# Patient Record
Sex: Female | Born: 1955 | Marital: Married | Smoking: Former smoker
Health system: Southern US, Community
[De-identification: ages and names within clinical notes are randomized; demographics above are authoritative.]

---

## 1993-06-02 HISTORY — PX: TUBAL LIGATION: SHX77

## 1998-11-08 DIAGNOSIS — Z87891 Personal history of nicotine dependence: Secondary | ICD-10-CM | POA: Insufficient documentation

## 2000-07-30 DIAGNOSIS — E7800 Pure hypercholesterolemia, unspecified: Secondary | ICD-10-CM | POA: Insufficient documentation

## 2003-02-28 DIAGNOSIS — I10 Essential (primary) hypertension: Secondary | ICD-10-CM | POA: Insufficient documentation

## 2005-04-10 ENCOUNTER — Ambulatory Visit: Payer: Self-pay | Admitting: Family Medicine

## 2006-05-06 ENCOUNTER — Ambulatory Visit: Payer: Self-pay | Admitting: Family Medicine

## 2007-05-11 ENCOUNTER — Ambulatory Visit: Payer: Self-pay | Admitting: Family Medicine

## 2007-10-21 ENCOUNTER — Ambulatory Visit: Payer: Self-pay | Admitting: Gastroenterology

## 2007-10-21 LAB — HM COLONOSCOPY: HM Colonoscopy: NORMAL

## 2008-08-30 ENCOUNTER — Ambulatory Visit: Payer: Self-pay | Admitting: Family Medicine

## 2009-09-05 ENCOUNTER — Ambulatory Visit: Payer: Self-pay | Admitting: Family Medicine

## 2010-09-17 ENCOUNTER — Ambulatory Visit: Payer: Self-pay | Admitting: Family Medicine

## 2011-11-11 ENCOUNTER — Ambulatory Visit: Payer: Self-pay | Admitting: Family Medicine

## 2012-10-11 LAB — HM PAP SMEAR

## 2012-10-11 LAB — BASIC METABOLIC PANEL
BUN: 10 mg/dL (ref 4–21)
CREATININE: 0.6 mg/dL (ref <=1.1)
GLUCOSE: 94 mg/dL
Potassium: 4.6 mmol/L (ref 3.4–5.3)
SODIUM: 139 mmol/L (ref 137–147)

## 2012-10-11 LAB — TSH: TSH: 0.55 u[IU]/mL (ref <=5.90)

## 2012-10-11 LAB — HEPATIC FUNCTION PANEL
ALT: 21 U/L (ref 7–35)
AST: 15 U/L (ref 13–35)

## 2012-10-14 LAB — CBC AND DIFFERENTIAL: Platelets: 367 10*3/uL (ref 150–399)

## 2012-11-11 ENCOUNTER — Ambulatory Visit: Payer: Self-pay | Admitting: Family Medicine

## 2012-12-14 ENCOUNTER — Ambulatory Visit: Payer: Self-pay

## 2013-10-11 LAB — CBC AND DIFFERENTIAL
HEMOGLOBIN: 13.2 g/dL (ref 12.0–16.0)
WBC: 8.8 10^3/mL

## 2013-10-14 LAB — CBC AND DIFFERENTIAL: HCT: 39 % (ref 36–46)

## 2013-11-24 ENCOUNTER — Ambulatory Visit: Payer: Self-pay | Admitting: Family Medicine

## 2013-11-24 LAB — HM MAMMOGRAPHY

## 2013-12-07 ENCOUNTER — Ambulatory Visit: Payer: Self-pay | Admitting: Unknown Physician Specialty

## 2014-10-03 DIAGNOSIS — Z8669 Personal history of other diseases of the nervous system and sense organs: Secondary | ICD-10-CM | POA: Insufficient documentation

## 2014-10-03 DIAGNOSIS — S92909A Unspecified fracture of unspecified foot, initial encounter for closed fracture: Secondary | ICD-10-CM | POA: Insufficient documentation

## 2014-10-03 DIAGNOSIS — R202 Paresthesia of skin: Secondary | ICD-10-CM | POA: Insufficient documentation

## 2014-10-03 DIAGNOSIS — R430 Anosmia: Secondary | ICD-10-CM | POA: Insufficient documentation

## 2014-10-03 DIAGNOSIS — G5600 Carpal tunnel syndrome, unspecified upper limb: Secondary | ICD-10-CM | POA: Insufficient documentation

## 2014-10-03 DIAGNOSIS — G5601 Carpal tunnel syndrome, right upper limb: Secondary | ICD-10-CM | POA: Insufficient documentation

## 2014-10-03 DIAGNOSIS — J309 Allergic rhinitis, unspecified: Secondary | ICD-10-CM | POA: Insufficient documentation

## 2014-11-27 ENCOUNTER — Ambulatory Visit (INDEPENDENT_AMBULATORY_CARE_PROVIDER_SITE_OTHER): Payer: Managed Care, Other (non HMO) | Admitting: Family Medicine

## 2014-11-27 ENCOUNTER — Encounter: Payer: Self-pay | Admitting: Family Medicine

## 2014-11-27 VITALS — BP 144/92 | HR 72 | Temp 98.7°F | Resp 16 | Ht 62.5 in | Wt 154.0 lb

## 2014-11-27 DIAGNOSIS — R319 Hematuria, unspecified: Secondary | ICD-10-CM | POA: Diagnosis not present

## 2014-11-27 DIAGNOSIS — Z1231 Encounter for screening mammogram for malignant neoplasm of breast: Secondary | ICD-10-CM | POA: Diagnosis not present

## 2014-11-27 DIAGNOSIS — Z1211 Encounter for screening for malignant neoplasm of colon: Secondary | ICD-10-CM | POA: Diagnosis not present

## 2014-11-27 DIAGNOSIS — M25512 Pain in left shoulder: Secondary | ICD-10-CM

## 2014-11-27 DIAGNOSIS — E7800 Pure hypercholesterolemia, unspecified: Secondary | ICD-10-CM

## 2014-11-27 DIAGNOSIS — I10 Essential (primary) hypertension: Secondary | ICD-10-CM

## 2014-11-27 DIAGNOSIS — Z0000 Encounter for general adult medical examination without abnormal findings: Secondary | ICD-10-CM

## 2014-11-27 DIAGNOSIS — M25511 Pain in right shoulder: Secondary | ICD-10-CM

## 2014-11-27 LAB — POCT URINALYSIS DIPSTICK
Bilirubin, UA: NEGATIVE
GLUCOSE UA: NEGATIVE
KETONES UA: NEGATIVE
Leukocytes, UA: NEGATIVE
Nitrite, UA: NEGATIVE
Urobilinogen, UA: 0.2
pH, UA: 5

## 2014-11-27 LAB — IFOBT (OCCULT BLOOD): IFOBT: NEGATIVE

## 2014-11-27 MED ORDER — LISINOPRIL-HYDROCHLOROTHIAZIDE 10-12.5 MG PO TABS: 1.0000 | ORAL_TABLET | Freq: Every day | ORAL | Status: DC

## 2014-11-27 NOTE — Progress Notes (Signed)
Patient ID: Kristen Wall, female   DOB: 1955/07/27, 59 y.o.   MRN: [id number]        Patient: Kristen Wall, Female    DOB: 01-08-1956, 59 y.o.   MRN: [id number] Visit Date: 11/27/2014  Today's Provider: Margarita Rana, MD   Chief Complaint  Patient presents with  . Annual Exam   Subjective:    Annual physical exam Kristen Wall is a 59 y.o. female who presents today for health maintenance and complete physical. She feels fairly well.  Pt as been having trouble bilateral shoulder pain.  Started a few months ago.   She reports exercising occasionally. She reports she is sleeping well.  ----------------------------------------------------------------- Shoulder Pain  The pain is present in the left shoulder and right shoulder. This is a new problem. The current episode started more than 1 month ago. There has been no history of extremity trauma. The problem occurs daily. The problem has been unchanged. The quality of the pain is described as dull and aching. The pain is at a severity of 4/10. The pain is mild. Associated symptoms include numbness (Fingers go numb.). Pertinent negatives include no joint locking, joint swelling or limited range of motion. The symptoms are aggravated by lying down. She has tried movement for the symptoms. The treatment provided mild relief.     Review of Systems  Constitutional: Positive for diaphoresis.  HENT: Negative.   Eyes: Negative.   Respiratory: Negative.   Cardiovascular: Positive for leg swelling.  Gastrointestinal: Negative.   Endocrine: Negative.   Genitourinary: Negative.   Musculoskeletal: Positive for myalgias and neck pain.  Skin: Negative.   Allergic/Immunologic: Negative.   Neurological: Positive for numbness (Fingers go numb.).  Hematological: Negative.   Psychiatric/Behavioral: Negative.     Social History She  reports that she quit smoking about 7 years ago. She has never used smokeless tobacco. She reports that she drinks  alcohol. She reports that she does not use illicit drugs.  Patient Active Problem List   Diagnosis Date Noted  . Allergic rhinitis 10/03/2014  . 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 Carpal tunnel syndrome 10/03/2014  . Closed fracture of foot 10/03/2014  . H/O: glaucoma 10/03/2014  . Burning or prickling sensation 10/03/2014  . BP (high blood pressure) 02/28/2003  . Hypercholesteremia 07/30/2000  . History of tobacco use 11/08/1998    Past Surgical History  Procedure Laterality Date  . Tubal ligation  1995    Family History Her family history includes Arthritis in her mother; Coronary artery disease in her mother; Diabetes in her mother; Esophageal cancer in her father; Heart attack in her brother and maternal grandmother; Hypertension in her mother.    Previous Medications   BIMATOPROST (LUMIGAN) 0.01 % SOLN    Apply to eye.   CHOLECALCIFEROL (VITAMIN D) 2000 UNITS TABLET    Take by mouth.   LISINOPRIL-HYDROCHLOROTHIAZIDE (PRINZIDE,ZESTORETIC) 10-12.5 MG PER TABLET    Take by mouth.   MULTIPLE VITAMINS PO    Take by mouth.   OMEGA 3-6-9 FATTY ACIDS PO    Take by mouth.   SIMVASTATIN (ZOCOR) 20 MG TABLET    Take by mouth.    Patient Care Team: Margarita Rana, MD as PCP - General (Family Medicine)     Objective:   Vitals: BP 144/92 mmHg  Pulse 72  Temp(Src) 98.7 F (37.1 C) (Oral)  Resp 16  Ht 5' 2.5" (1.588 m)  Wt 154 lb (69.854 kg)  BMI 27.70 kg/m2   Physical Exam  Constitutional:  She is oriented to person, place, and time. She appears well-developed and well-nourished.  HENT:  Head: Normocephalic and atraumatic.  Right Ear: Hearing, tympanic membrane, external ear and ear canal normal.  Left Ear: Hearing, tympanic membrane, external ear and ear canal normal.  Nose: Nose normal.  Mouth/Throat: Uvula is midline, oropharynx is clear and moist and mucous membranes are normal.  Eyes: Conjunctivae, EOM and lids are normal. Pupils are equal, round, and reactive to  light.  Neck: Trachea normal and normal range of motion. Neck supple. Carotid bruit is not present.  Cardiovascular: Normal rate, regular rhythm and normal heart sounds.   Pulmonary/Chest: Effort normal and breath sounds normal. Right breast exhibits no inverted nipple, no mass, no nipple discharge, no skin change and no tenderness. Left breast exhibits no inverted nipple, no mass, no nipple discharge, no skin change and no tenderness. Breasts are symmetrical.  Abdominal: Soft. Normal appearance, normal aorta and bowel sounds are normal.  Musculoskeletal: Normal range of motion.  Tender over Trapezius Muscle bilaterally.  Neurological: She is alert and oriented to person, place, and time.  Skin: Skin is warm, dry and intact.  Psychiatric: She has a normal mood and affect. Judgment normal. Cognition and memory are normal.     Depression Screen No flowsheet data found.    Assessment & Plan:     Routine Health Maintenance and Physical Exam  Exercise Activities and Dietary recommendations Goals    None     Discussed health benefits of physical activity, and encouraged her to engage in regular exercise appropriate for her age and condition.     2. Essential hypertension Stable, refills provided, checked labs.   - lisinopril-hydrochlorothiazide (PRINZIDE,ZESTORETIC) 10-12.5 MG per tablet; Take 1 tablet by mouth daily.  Dispense: 30 tablet; Refill: 5 - CBC with Differential/Platelet - Comprehensive metabolic panel - TSH  3. Encounter for screening mammogram for breast cancer - MM Digital Screening  4. Hypercholesteremia Will check labs.   - Lipid panel  5. Hematuria History of Hematuria in the past.  Provided pt with a lab sheet to recheck when she gets her blood work done.    - POCT urinalysis dipstick - Urinalysis, Routine w reflex microscopic  6. Screening for colon cancer [facility] [place] negative.   - IFOBT POC (occult bld, rslt in office)  7. Bilateral shoulder  pain New problem, will continue to monitor.  She will call if she needs a referral to Orthopedics.    Patient was seen and examined by Jerrell Belfast, MD, and note scribed by Ashley Royalty, CMA.  I have reviewed the document for accuracy and completeness and I agree with above. Jerrell Belfast, MD   Margarita Rana, MD   --------------------------------------------------------------------

## 2014-11-29 ENCOUNTER — Other Ambulatory Visit
Admission: RE | Admit: 2014-11-29 | Discharge: 2014-11-29 | Disposition: A | Discharge status: Home / Self Care | Payer: Managed Care, Other (non HMO) | Source: Ambulatory Visit | Attending: Family Medicine | Admitting: Family Medicine

## 2014-11-29 DIAGNOSIS — R319 Hematuria, unspecified: Secondary | ICD-10-CM | POA: Insufficient documentation

## 2014-11-29 DIAGNOSIS — I10 Essential (primary) hypertension: Secondary | ICD-10-CM | POA: Diagnosis not present

## 2014-11-29 DIAGNOSIS — E780 Pure hypercholesterolemia: Secondary | ICD-10-CM | POA: Diagnosis present

## 2014-11-29 LAB — CBC WITH DIFFERENTIAL/PLATELET
BASOS ABS: 0 10*3/uL (ref 0–0.1)
Basophils Relative: 1 %
EOS PCT: 1 %
Eosinophils Absolute: 0.1 10*3/uL (ref 0–0.7)
HCT: 40.9 % (ref 35.0–47.0)
Hemoglobin: 14 g/dL (ref 12.0–16.0)
LYMPHS ABS: 1.2 10*3/uL (ref 1.0–3.6)
Lymphocytes Relative: 21 %
MCH: 31.3 pg (ref 26.0–34.0)
MCHC: 34.1 g/dL (ref 32.0–36.0)
MCV: 91.9 fL (ref 80.0–100.0)
MONOS PCT: 8 %
Monocytes Absolute: 0.5 10*3/uL (ref 0.2–0.9)
NEUTROS PCT: 69 %
Neutro Abs: 4.1 10*3/uL (ref 1.4–6.5)
PLATELETS: 302 10*3/uL (ref 150–440)
RBC: 4.46 MIL/uL (ref 3.80–5.20)
RDW: 12.5 % (ref 11.5–14.5)
WBC: 5.9 10*3/uL (ref 3.6–11.0)

## 2014-11-29 LAB — LIPID PANEL
CHOL/HDL RATIO: 5.3 ratio
CHOLESTEROL: 295 mg/dL — AB (ref 0–200)
HDL: 56 mg/dL (ref >40)
LDL Cholesterol: UNDETERMINED mg/dL (ref 0–99)
TRIGLYCERIDES: 456 mg/dL — AB (ref <150)
VLDL: UNDETERMINED mg/dL (ref 0–40)

## 2014-11-29 LAB — URINALYSIS COMPLETE WITH MICROSCOPIC (ARMC ONLY)
BILIRUBIN URINE: NEGATIVE
Glucose, UA: NEGATIVE mg/dL
KETONES UR: NEGATIVE mg/dL
Leukocytes, UA: NEGATIVE
Nitrite: NEGATIVE
Protein, ur: NEGATIVE mg/dL
Specific Gravity, Urine: 1.02 (ref 1.005–1.030)
pH: 5.5 (ref 5.0–8.0)

## 2014-11-29 LAB — COMPREHENSIVE METABOLIC PANEL
ALT: 32 U/L (ref 14–54)
ANION GAP: 12 (ref 5–15)
AST: 25 U/L (ref 15–41)
Albumin: 4.4 g/dL (ref 3.5–5.0)
Alkaline Phosphatase: 71 U/L (ref 38–126)
BILIRUBIN TOTAL: 0.3 mg/dL (ref 0.3–1.2)
BUN: 9 mg/dL (ref 6–20)
CALCIUM: 9.1 mg/dL (ref 8.9–10.3)
CHLORIDE: 98 mmol/L — AB (ref 101–111)
CO2: 26 mmol/L (ref 22–32)
Creatinine, Ser: 0.51 mg/dL (ref 0.44–1.00)
Glucose, Bld: 116 mg/dL — ABNORMAL HIGH (ref 65–99)
Potassium: 3.9 mmol/L (ref 3.5–5.1)
Sodium: 136 mmol/L (ref 135–145)
Total Protein: 7.8 g/dL (ref 6.5–8.1)

## 2014-11-29 LAB — TSH: TSH: 0.975 u[IU]/mL (ref 0.350–4.500)

## 2014-11-30 ENCOUNTER — Telehealth: Payer: Self-pay

## 2014-11-30 NOTE — Telephone Encounter (Signed)
Pt advised; apt made for 12/12/2014  Thanks,   -Mickel Baas

## 2014-11-30 NOTE — Telephone Encounter (Signed)
-----   Message from Margarita Rana, MD sent at 11/29/2014  7:32 PM EDT ----- Cholesterol high at 295 and high blood sugar and triglycerides. Needs follow up to check A1c and discuss treatment plan. Thanks.

## 2014-12-12 ENCOUNTER — Encounter: Payer: Self-pay | Admitting: Family Medicine

## 2014-12-12 ENCOUNTER — Ambulatory Visit (INDEPENDENT_AMBULATORY_CARE_PROVIDER_SITE_OTHER): Payer: Managed Care, Other (non HMO) | Admitting: Family Medicine

## 2014-12-12 VITALS — BP 126/78 | HR 64 | Temp 98.6°F | Resp 16 | Ht 63.0 in | Wt 158.0 lb

## 2014-12-12 DIAGNOSIS — E7800 Pure hypercholesterolemia, unspecified: Secondary | ICD-10-CM

## 2014-12-12 DIAGNOSIS — I10 Essential (primary) hypertension: Secondary | ICD-10-CM

## 2014-12-12 LAB — POCT GLYCOSYLATED HEMOGLOBIN (HGB A1C): Hemoglobin A1C: 5.4

## 2014-12-12 MED ORDER — SIMVASTATIN 20 MG PO TABS
20.0000 mg | ORAL_TABLET | Freq: Every evening | ORAL | Status: DC
Start: 2014-12-12 — End: 2016-01-27

## 2014-12-12 NOTE — Progress Notes (Signed)
Patient ID: Kristen Wall, female   DOB: 25-May-1956, 59 y.o.   MRN: [id number]       Patient: Kristen Wall Female    DOB: [birth year]   58 y.o.   MRN: [id number] Visit Date: 12/12/2014  Today's Provider: Margarita Rana, MD   Chief Complaint  Patient presents with  . Hypertension    follow up    Subjective:    Hypertension This is a chronic problem. The problem is unchanged. The problem is controlled. Pertinent negatives include no blurred vision, headaches, palpitations, peripheral edema, shortness of breath or sweats. Risk factors: pre-diabetes. The current treatment provides significant improvement. There are no compliance problems.    Cholesterol, blood sugar and triglycerides elevated when labs drawn. Here to discuss treatment today.  Does have family history of diabetes.  Does not exercise currently.     No Known Allergies Previous Medications   BIMATOPROST (LUMIGAN) 0.01 % SOLN    Apply to eye.   CHOLECALCIFEROL (VITAMIN D) 2000 UNITS TABLET    Take by mouth.   LISINOPRIL-HYDROCHLOROTHIAZIDE (PRINZIDE,ZESTORETIC) 10-12.5 MG PER TABLET    Take 1 tablet by mouth daily.   MULTIPLE VITAMINS PO    Take by mouth.   OMEGA 3-6-9 FATTY ACIDS PO    Take by mouth.   SIMVASTATIN (ZOCOR) 20 MG TABLET    Take by mouth.    Review of Systems  Constitutional: Negative.   Eyes: Negative for blurred vision.  Respiratory: Negative for shortness of breath.   Cardiovascular: Negative.  Negative for palpitations.  Neurological: Negative for headaches.    History  Substance Use Topics  . Smoking status: Former Smoker    Quit date: 06/03/2007  . Smokeless tobacco: Never Used  . Alcohol Use: Yes     Comment: Occasional, Drinks beer for social events.   Objective:   BP 126/78 mmHg  Pulse 64  Temp(Src) 98.6 F (37 C)  Resp 16  Ht 5\' 3"  (1.6 m)  Wt 158 lb (71.668 kg)  BMI 28.00 kg/m2  Physical Exam  Constitutional: She is oriented to person, place, and time. She appears  well-developed and well-nourished.  Neurological: She is alert and oriented to person, place, and time.  Psychiatric: She has a normal mood and affect. Her behavior is normal. Judgment and thought content normal.        Assessment & Plan:     1. Essential hypertension Stable. Continue medication.   2. Hypercholesteremia Will restart medication. Did not have any trouble with medication in the past.  - simvastatin (ZOCOR) 20 MG tablet; Take 1 tablet (20 mg total) by mouth every evening.  Dispense: 90 tablet; Refill: 3   3. For elevated triglycerides and borderline sugar-   Results for orders placed or performed in visit on 12/12/14 (from the past 24 hour(s))  POCT HgB A1C     Status: None   Collection Time: 12/12/14  5:41 PM  Result Value Ref Range   Hemoglobin A1C 5.4    Goals    . Exercise 150 minutes per week (moderate activity)    . Reduce portion size     Decrease carbohydrates.        Follow up:     As previously scheduled.   Margarita Rana, MD  [facility]

## 2015-01-03 ENCOUNTER — Ambulatory Visit
Admission: RE | Admit: 2015-01-03 | Discharge: 2015-01-03 | Disposition: A | Discharge status: Home / Self Care | Payer: Managed Care, Other (non HMO) | Source: Ambulatory Visit | Attending: Family Medicine | Admitting: Family Medicine

## 2015-01-03 DIAGNOSIS — Z1231 Encounter for screening mammogram for malignant neoplasm of breast: Secondary | ICD-10-CM | POA: Insufficient documentation

## 2015-08-01 ENCOUNTER — Other Ambulatory Visit: Payer: Self-pay | Admitting: Family Medicine

## 2015-08-01 DIAGNOSIS — I10 Essential (primary) hypertension: Secondary | ICD-10-CM

## 2015-11-29 ENCOUNTER — Ambulatory Visit (INDEPENDENT_AMBULATORY_CARE_PROVIDER_SITE_OTHER): Payer: Managed Care, Other (non HMO) | Admitting: Family Medicine

## 2015-11-29 ENCOUNTER — Encounter: Payer: Self-pay | Admitting: Family Medicine

## 2015-11-29 VITALS — BP 118/80 | HR 68 | Temp 98.7°F | Resp 16 | Ht 63.0 in | Wt 159.0 lb

## 2015-11-29 DIAGNOSIS — R319 Hematuria, unspecified: Secondary | ICD-10-CM

## 2015-11-29 DIAGNOSIS — I10 Essential (primary) hypertension: Secondary | ICD-10-CM | POA: Diagnosis not present

## 2015-11-29 DIAGNOSIS — E7800 Pure hypercholesterolemia, unspecified: Secondary | ICD-10-CM | POA: Diagnosis not present

## 2015-11-29 DIAGNOSIS — R7309 Other abnormal glucose: Secondary | ICD-10-CM | POA: Insufficient documentation

## 2015-11-29 DIAGNOSIS — Z0000 Encounter for general adult medical examination without abnormal findings: Secondary | ICD-10-CM

## 2015-11-29 LAB — POCT URINALYSIS DIPSTICK
Bilirubin, UA: NEGATIVE
GLUCOSE UA: NEGATIVE
Ketones, UA: NEGATIVE
Leukocytes, UA: NEGATIVE
Nitrite, UA: NEGATIVE
Protein, UA: NEGATIVE
SPEC GRAV UA: 1.01
UROBILINOGEN UA: 0.2
pH, UA: 7.5

## 2015-11-29 NOTE — Progress Notes (Deleted)
         Patient: Kristen Wall Female    DOB: Oct 17, 1955   60 y.o.   MRN: [id number] Visit Date: 11/29/2015  Today's Provider: Margarita Rana, MD   Chief Complaint  Patient presents with  . Annual Exam   Subjective:    HPI     No Known Allergies No outpatient prescriptions have been marked as taking for the 11/29/15 encounter (Office Visit) with Margarita Rana, MD.    Review of Systems  Social History  Substance Use Topics  . Smoking status: Former Smoker    Quit date: 06/03/2007  . Smokeless tobacco: Never Used  . Alcohol Use: Yes     Comment: Occasional, Drinks beer for social events.   Objective:   There were no vitals taken for this visit.  Physical Exam      Assessment & Plan:           Margarita Rana, MD  [facility]

## 2015-11-29 NOTE — Progress Notes (Signed)
Patient: Kristen Wall, Female    DOB: 09/27/1955, 60 y.o.   MRN: [id number] Visit Date: 11/29/2015  Today's Provider: Margarita Rana, MD   Chief Complaint  Patient presents with  . Annual Exam   Subjective:    Annual physical exam Kristen Wall is a 60 y.o. female who presents today for health maintenance and complete physical. She feels well. She reports exercising regularly. She reports she is sleeping well.  -----------------------------------------------------------------   Review of Systems  Constitutional: Negative.   HENT: Negative.   Eyes: Positive for redness. Negative for photophobia, pain, discharge, itching and visual disturbance.  Respiratory: Negative.   Cardiovascular: Negative.   Gastrointestinal: Negative.   Endocrine: Negative.   Genitourinary: Negative.   Musculoskeletal: Negative.   Skin: Negative.   Allergic/Immunologic: Negative.   Neurological: Negative.   Hematological: Negative.   Psychiatric/Behavioral: Negative.     Social History      She  reports that she quit smoking about 8 years ago. She has never used smokeless tobacco. She reports that she drinks alcohol. She reports that she does not use illicit drugs.       Social History   Social History  . Marital Status: Married    Spouse Name: [name]  . Number of Children: 2  . Years of Education: College   Occupational History  . [employer]     Full-Time   Social History Main Topics  . Smoking status: Former Smoker    Quit date: 06/03/2007  . Smokeless tobacco: Never Used  . Alcohol Use: Yes     Comment: Occasional, Drinks beer for social events.  . Drug Use: No  . Sexual Activity: Not Asked   Other Topics Concern  . None   Social History Narrative    No past medical history on file.   Patient Active Problem List   Diagnosis Date Noted  . Hematuria 11/27/2014  . Screening for colon cancer 11/27/2014  . Allergic rhinitis 10/03/2014  . Absent sense of smell  10/03/2014  . Carpal tunnel syndrome 10/03/2014  . Closed fracture of foot 10/03/2014  . H/O: glaucoma 10/03/2014  . Burning or prickling sensation 10/03/2014  . BP (high blood pressure) 02/28/2003  . Hypercholesteremia 07/30/2000  . History of tobacco use 11/08/1998    Past Surgical History  Procedure Laterality Date  . Tubal ligation  1995    Family History        Family Status  Relation Status Death Age  . Mother Deceased 85  . Father Deceased 26  . Brother Deceased 73  . Maternal Grandmother Deceased         Her family history includes Arthritis in her mother; Breast cancer in her cousin and other; Coronary artery disease in her mother; Diabetes in her mother; Esophageal cancer in her father; Heart attack in her brother and maternal grandmother; Hypertension in her mother.    No Known Allergies  No outpatient prescriptions have been marked as taking for the 11/29/15 encounter (Office Visit) with Margarita Rana, MD.    Patient Care Team: Margarita Rana, MD as PCP - General (Family Medicine)     Objective:   Vitals: BP 118/80 mmHg  Pulse 68  Temp(Src) 98.7 F (37.1 C) (Oral)  Resp 16  Ht 5\' 3"  (1.6 m)  Wt 159 lb (72.122 kg)  BMI 28.17 kg/m2   Physical Exam  Constitutional: She is oriented to person, place, and time. She appears well-developed and well-nourished.  HENT:  Head: Normocephalic and atraumatic.  Right Ear: External ear normal.  Left Ear: External ear normal.  Nose: Nose normal.  Mouth/Throat: Oropharynx is clear and moist.  Eyes: Conjunctivae and EOM are normal. Pupils are equal, round, and reactive to light.  Neck: Normal range of motion. Neck supple. Carotid bruit is not present.  Cardiovascular: Normal rate, regular rhythm, normal heart sounds and intact distal pulses.   Pulmonary/Chest: Effort normal and breath sounds normal. Right breast exhibits no inverted nipple, no mass, no nipple discharge, no skin change and no tenderness. Left breast  exhibits no inverted nipple, no mass, no nipple discharge, no skin change and no tenderness. Breasts are symmetrical.  Abdominal: Soft. Bowel sounds are normal.  Neurological: She is alert and oriented to person, place, and time. She has normal reflexes.  Skin: Skin is warm and dry.  Psychiatric: She has a normal mood and affect. Her behavior is normal. Judgment and thought content normal.      Assessment & Plan:     Routine Health Maintenance and Physical Exam  Exercise Activities and Dietary recommendations Goals    . Exercise 150 minutes per week (moderate activity)    . Reduce portion size     Decrease carbohydrates.        Immunization History  Administered Date(s) Administered  . Td 06/23/1995    Health Maintenance  Topic Date Due  . Hepatitis C Screening  07/24/1955  . HIV Screening  08/30/1971  . TETANUS/TDAP  06/22/2005  . PAP SMEAR  10/12/2015  . INFLUENZA VACCINE  01/01/2016  . MAMMOGRAM  01/02/2017  . COLONOSCOPY  10/20/2017      Discussed health benefits of physical activity, and encouraged her to engage in regular exercise appropriate for her age and condition.   2. Essential hypertension Stable; continue current medications.   - CBC with Differential/Platelet - Comprehensive metabolic panel - TSH  3. Hematuria Pt has a history of this; will send for U/A. Was no blood when sent to labs last time.    - Urinalysis, Routine w reflex microscopic  4. Hypercholesteremia Stable; will check labs.   - Lipid panel  5. Abnormal glucose Stable; will check labs.  - Comprehensive metabolic panel - Hemoglobin A1c  -------------------------------------------------------------------- Patient was seen and examined by Jerrell Belfast, MD, and note scribed by Ashley Royalty, CMA. I have reviewed the document for accuracy and completeness and I agree with above. - Jerrell Belfast, MD     Margarita Rana, MD  [facility]

## 2015-11-30 LAB — URINALYSIS, ROUTINE W REFLEX MICROSCOPIC
BILIRUBIN UA: NEGATIVE
Glucose, UA: NEGATIVE
KETONES UA: NEGATIVE
LEUKOCYTES UA: NEGATIVE
Nitrite, UA: NEGATIVE
PROTEIN UA: NEGATIVE
RBC UA: NEGATIVE
SPEC GRAV UA: 1.02 (ref 1.005–1.030)
Urobilinogen, Ur: 0.2 mg/dL (ref 0.2–1.0)
pH, UA: 7 (ref 5.0–7.5)

## 2015-11-30 LAB — PLEASE NOTE

## 2015-12-05 ENCOUNTER — Encounter: Payer: Managed Care, Other (non HMO) | Admitting: Family Medicine

## 2016-01-07 ENCOUNTER — Telehealth: Payer: Self-pay | Admitting: Physician Assistant

## 2016-01-07 DIAGNOSIS — Z1239 Encounter for other screening for malignant neoplasm of breast: Secondary | ICD-10-CM

## 2016-01-07 NOTE — Telephone Encounter (Signed)
Is it okay to order? Or should pt establish care? Renaldo Fiddler, CMA

## 2016-01-07 NOTE — Telephone Encounter (Signed)
Pt needs to have a mammogram scheduled in [place].  She called to make an appt and they ask her to have [place] make the appt.  She was Dr. Sharyon Medicus pat. But will be seeing Sonia Baller.  Her call back is [phone]  Thanks Con Memos

## 2016-01-07 NOTE — Telephone Encounter (Signed)
Pt advised. Kristen Wall, CMA  

## 2016-01-07 NOTE — Telephone Encounter (Signed)
Mammogram ordered since she just had CPE with [facility] on 11/29/15. She may call back to schedule now.

## 2016-01-16 ENCOUNTER — Ambulatory Visit: Admission: RE | Admit: 2016-01-16 | Payer: 59 | Source: Ambulatory Visit

## 2016-01-16 ENCOUNTER — Ambulatory Visit
Admission: RE | Admit: 2016-01-16 | Discharge: 2016-01-16 | Disposition: A | Discharge status: Home / Self Care | Payer: Managed Care, Other (non HMO) | Source: Ambulatory Visit | Attending: Physician Assistant | Admitting: Physician Assistant

## 2016-01-16 ENCOUNTER — Other Ambulatory Visit: Payer: Self-pay | Admitting: Physician Assistant

## 2016-01-16 DIAGNOSIS — Z1239 Encounter for other screening for malignant neoplasm of breast: Secondary | ICD-10-CM | POA: Diagnosis not present

## 2016-01-16 DIAGNOSIS — Z1231 Encounter for screening mammogram for malignant neoplasm of breast: Secondary | ICD-10-CM | POA: Insufficient documentation

## 2016-01-17 ENCOUNTER — Telehealth: Payer: Self-pay

## 2016-01-17 NOTE — Telephone Encounter (Signed)
-----   Message from [name], [place] sent at 01/17/2016 11:04 AM EDT ----- Normal mammogram. Repeat screening in one year.

## 2016-01-17 NOTE — Telephone Encounter (Signed)
LMTCB  Thanks, [employer]

## 2016-01-18 NOTE — Telephone Encounter (Signed)
Pt advised.   Thanks,   -Laura  

## 2016-01-27 ENCOUNTER — Other Ambulatory Visit: Payer: Self-pay | Admitting: Family Medicine

## 2016-01-27 DIAGNOSIS — E7800 Pure hypercholesterolemia, unspecified: Secondary | ICD-10-CM

## 2016-01-28 NOTE — Telephone Encounter (Signed)
Jenni's pt.   Thanks,   -Lama Narayanan  

## 2016-01-31 IMAGING — MG MM DIGITAL SCREENING BILAT W/ CAD
1 series · 4 of 4 positions shown · non-contrast
Comparison: Previous exam(s).

CLINICAL DATA: Screening.

EXAM:
DIGITAL SCREENING BILATERAL MAMMOGRAM WITH CAD

[R CC · right · 4 of 4 slices shown]
[im 1/4]
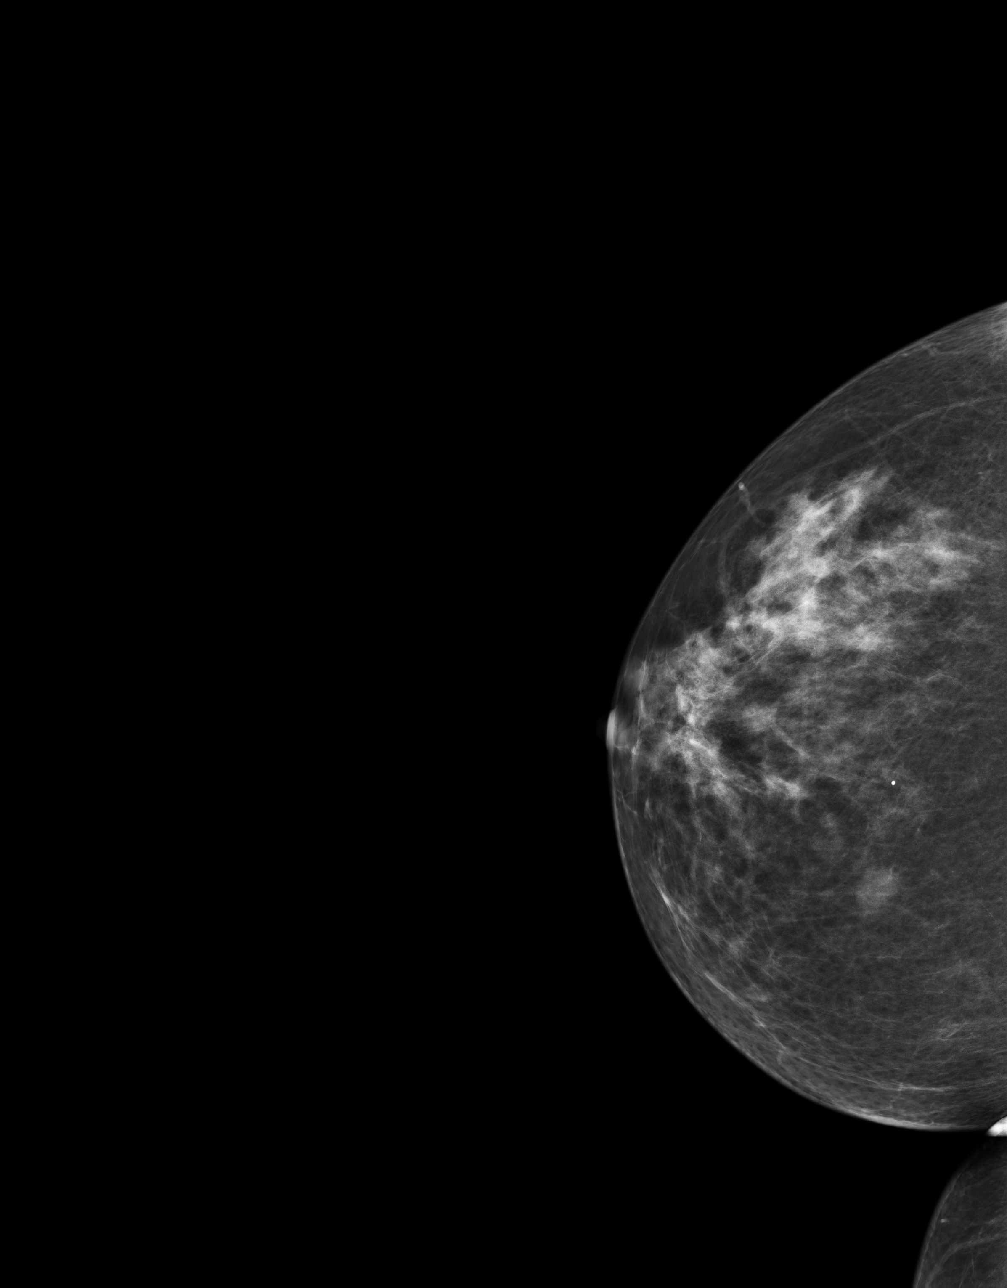
[im 2/4]
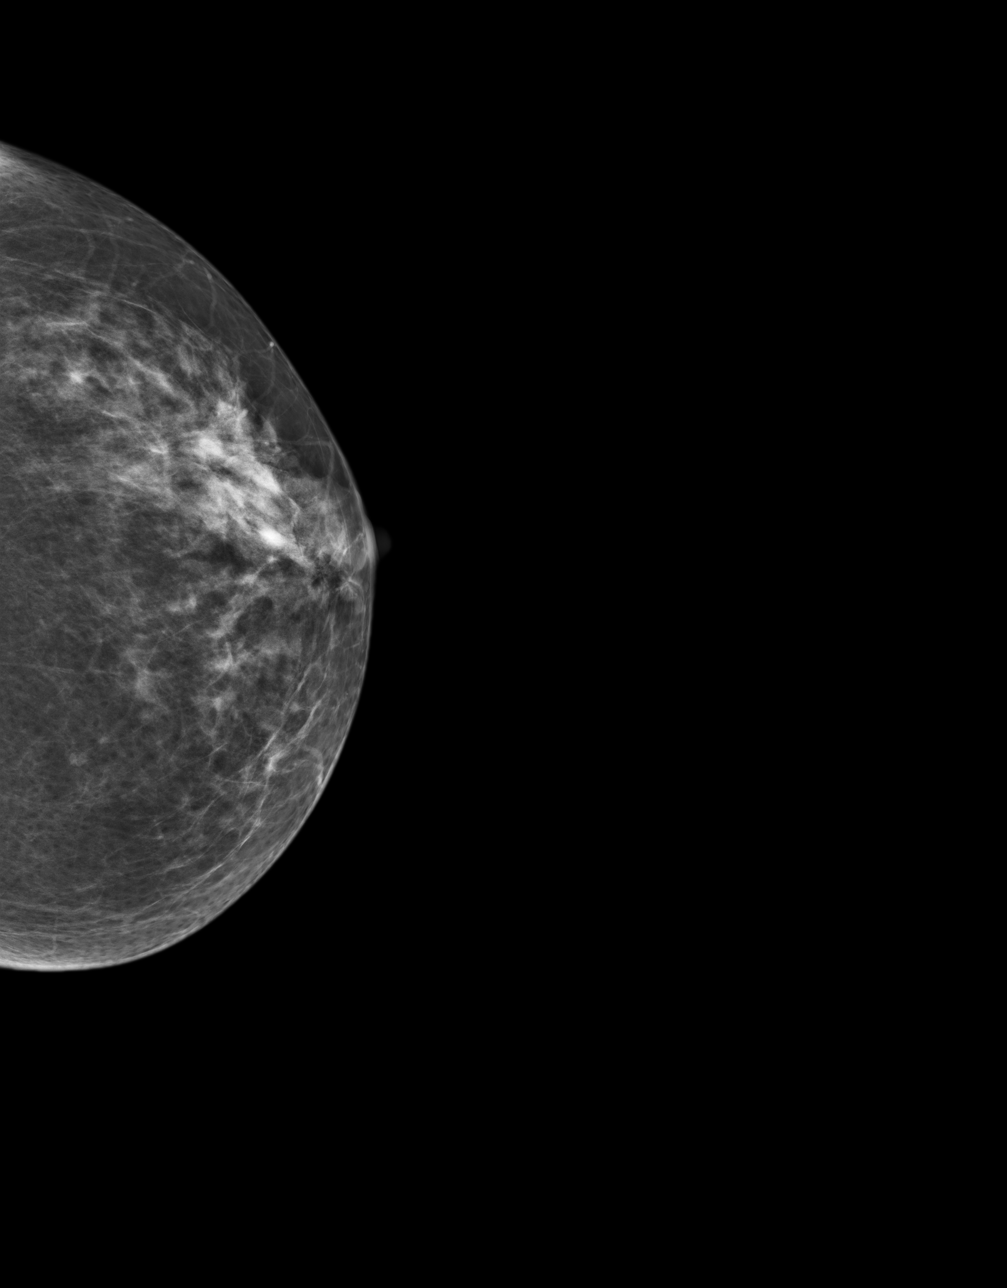
[im 3/4]
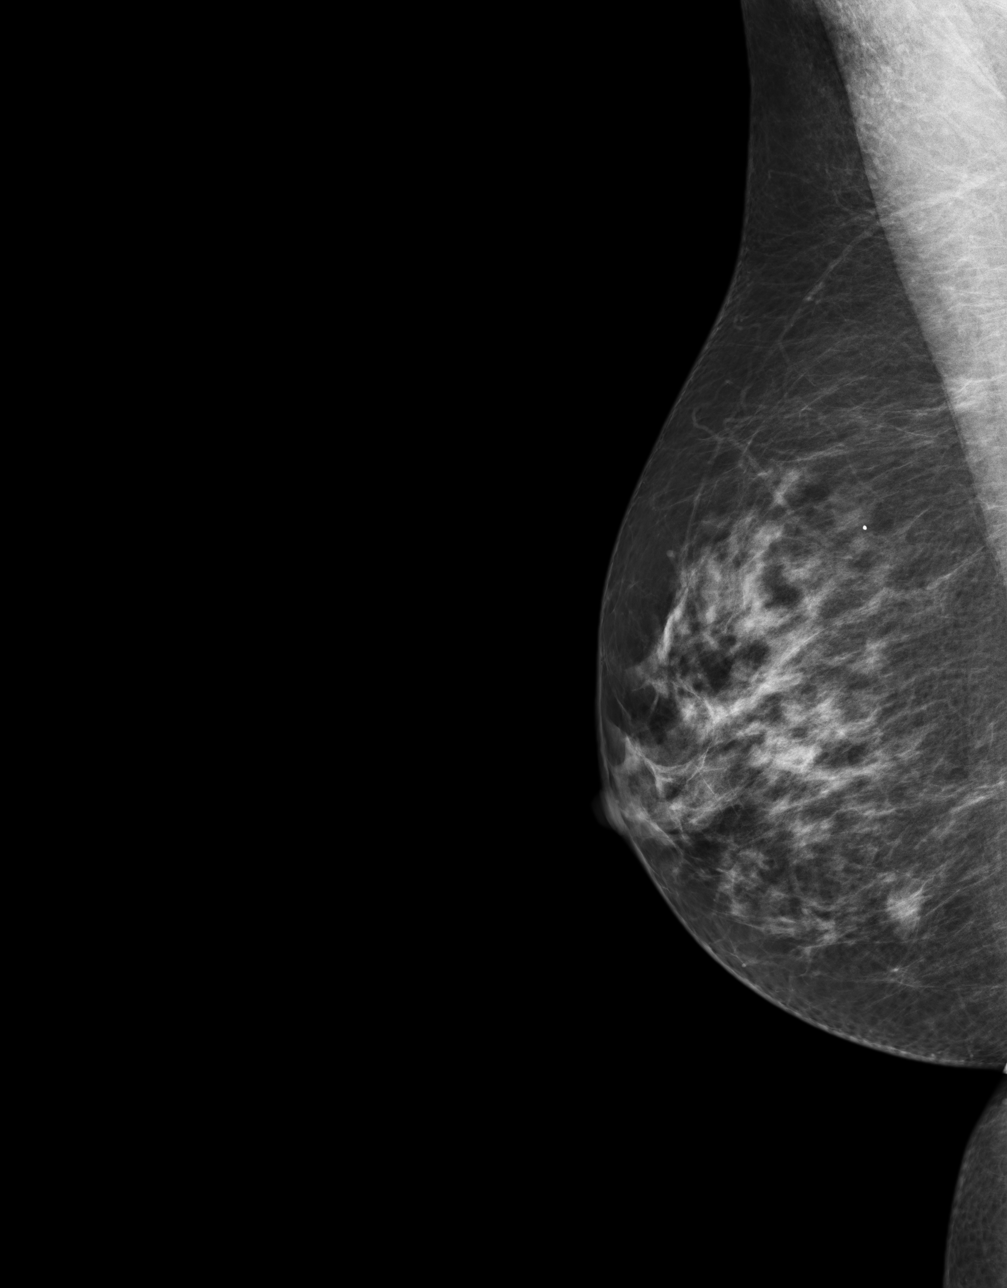
[im 4/4]
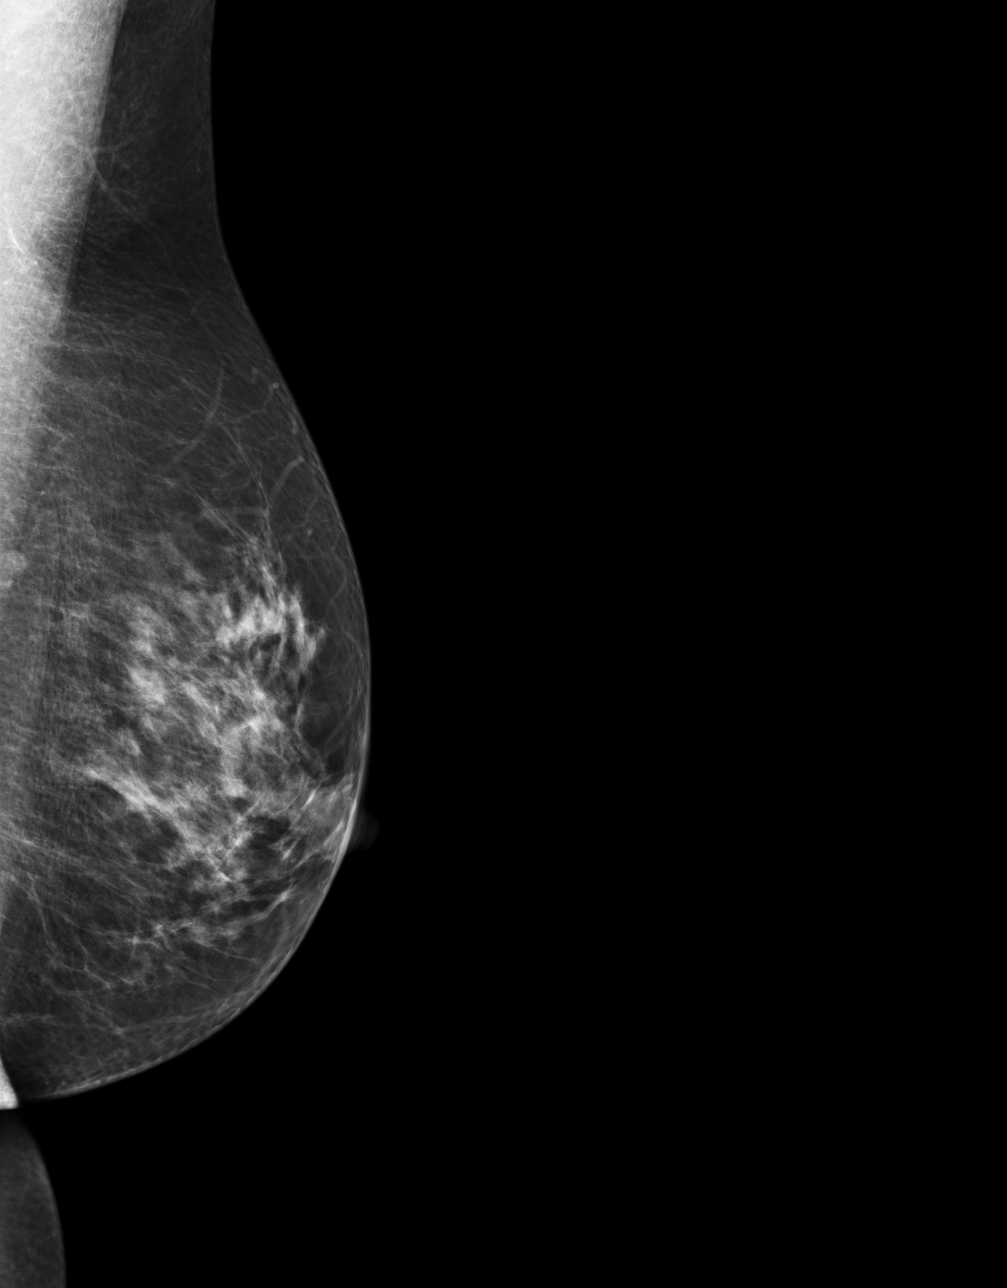

[4 of 4 positions shown; findings below may reference images not displayed]

ACR Breast Density Category c: The breast tissue is heterogeneously
dense, which may obscure small masses.
FINDINGS: There are no findings suspicious for malignancy. Images were
processed with CAD.
IMPRESSION: No mammographic evidence of malignancy. A result letter of this
screening mammogram will be mailed directly to the patient.

RECOMMENDATION:
Screening mammogram in one year. (Code:YJ-[id number])

BI-RADS CATEGORY  1: Negative.

## 2016-02-02 LAB — LIPID PANEL
CHOLESTEROL TOTAL: 262 mg/dL — AB (ref 100–199)
Chol/HDL Ratio: 4.3 ratio units (ref 0.0–4.4)
HDL: 61 mg/dL (ref >39)
Triglycerides: 405 mg/dL — ABNORMAL HIGH (ref 0–149)

## 2016-02-02 LAB — CBC WITH DIFFERENTIAL/PLATELET
BASOS ABS: 0 10*3/uL (ref 0.0–0.2)
BASOS: 0 %
EOS (ABSOLUTE): 0.1 10*3/uL (ref 0.0–0.4)
Eos: 1 %
Hematocrit: 39.7 % (ref 34.0–46.6)
Hemoglobin: 13.5 g/dL (ref 11.1–15.9)
Immature Grans (Abs): 0 10*3/uL (ref 0.0–0.1)
Immature Granulocytes: 0 %
LYMPHS ABS: 1.7 10*3/uL (ref 0.7–3.1)
Lymphs: 30 %
MCH: 31.7 pg (ref 26.6–33.0)
MCHC: 34 g/dL (ref 31.5–35.7)
MCV: 93 fL (ref 79–97)
Monocytes Absolute: 0.7 10*3/uL (ref 0.1–0.9)
Monocytes: 12 %
NEUTROS ABS: 3.2 10*3/uL (ref 1.4–7.0)
Neutrophils: 57 %
PLATELETS: 311 10*3/uL (ref 150–379)
RBC: 4.26 x10E6/uL (ref 3.77–5.28)
RDW: 13.3 % (ref 12.3–15.4)
WBC: 5.6 10*3/uL (ref 3.4–10.8)

## 2016-02-02 LAB — COMPREHENSIVE METABOLIC PANEL
A/G RATIO: 1.7 (ref 1.2–2.2)
ALK PHOS: 67 IU/L (ref 39–117)
ALT: 29 IU/L (ref 0–32)
AST: 25 IU/L (ref 0–40)
Albumin: 4.7 g/dL (ref 3.5–5.5)
BUN/Creatinine Ratio: 19 (ref 9–23)
BUN: 11 mg/dL (ref 6–24)
Bilirubin Total: 0.2 mg/dL (ref 0.0–1.2)
CHLORIDE: 102 mmol/L (ref 96–106)
CO2: 25 mmol/L (ref 18–29)
Calcium: 9.4 mg/dL (ref 8.7–10.2)
Creatinine, Ser: 0.59 mg/dL (ref 0.57–1.00)
GFR calc Af Amer: 116 mL/min/{1.73_m2} (ref >59)
GFR calc non Af Amer: 101 mL/min/{1.73_m2} (ref >59)
GLUCOSE: 85 mg/dL (ref 65–99)
Globulin, Total: 2.8 g/dL (ref 1.5–4.5)
Potassium: 4.6 mmol/L (ref 3.5–5.2)
Sodium: 145 mmol/L — ABNORMAL HIGH (ref 134–144)
Total Protein: 7.5 g/dL (ref 6.0–8.5)

## 2016-02-02 LAB — TSH: TSH: 0.619 u[IU]/mL (ref 0.450–4.500)

## 2016-02-02 LAB — HEMOGLOBIN A1C
Est. average glucose Bld gHb Est-mCnc: 117 mg/dL
Hgb A1c MFr Bld: 5.7 % — ABNORMAL HIGH (ref 4.8–5.6)

## 2016-02-05 ENCOUNTER — Telehealth: Payer: Self-pay

## 2016-02-05 NOTE — Telephone Encounter (Signed)
-----   Message from [name], [place] sent at 02/05/2016 10:03 AM EDT ----- HgBA1c has increased slightly from previous and is borderline high at 5.7. Cholesterol is slightly improved from last year. Continue simvastatin and lifestyle modifications including healthy diet (limiting fats and carbs from diet) as well as increasing physical activity (AHA recommends 150 min/week). Will recheck in 1 year.

## 2016-02-05 NOTE — Telephone Encounter (Signed)
Patient advised as directed below. She reports already started working on it and will continue.  Thanks,  -Ronnett Pullin

## 2016-03-10 ENCOUNTER — Other Ambulatory Visit: Payer: Self-pay | Admitting: Family Medicine

## 2016-03-10 DIAGNOSIS — I10 Essential (primary) hypertension: Secondary | ICD-10-CM

## 2016-11-22 ENCOUNTER — Other Ambulatory Visit: Payer: Self-pay | Admitting: Physician Assistant

## 2016-11-22 DIAGNOSIS — I10 Essential (primary) hypertension: Secondary | ICD-10-CM

## 2016-12-01 ENCOUNTER — Encounter: Payer: Self-pay | Admitting: Physician Assistant

## 2016-12-02 ENCOUNTER — Ambulatory Visit (INDEPENDENT_AMBULATORY_CARE_PROVIDER_SITE_OTHER): Payer: Managed Care, Other (non HMO) | Admitting: Physician Assistant

## 2016-12-02 ENCOUNTER — Encounter: Payer: Self-pay | Admitting: Physician Assistant

## 2016-12-02 VITALS — BP 118/72 | HR 71 | Temp 98.4°F | Resp 16 | Ht 63.0 in | Wt 148.2 lb

## 2016-12-02 DIAGNOSIS — Z1231 Encounter for screening mammogram for malignant neoplasm of breast: Secondary | ICD-10-CM | POA: Diagnosis not present

## 2016-12-02 DIAGNOSIS — Z0000 Encounter for general adult medical examination without abnormal findings: Secondary | ICD-10-CM | POA: Diagnosis not present

## 2016-12-02 DIAGNOSIS — R7309 Other abnormal glucose: Secondary | ICD-10-CM | POA: Diagnosis not present

## 2016-12-02 DIAGNOSIS — Z114 Encounter for screening for human immunodeficiency virus [HIV]: Secondary | ICD-10-CM | POA: Diagnosis not present

## 2016-12-02 DIAGNOSIS — Z1239 Encounter for other screening for malignant neoplasm of breast: Secondary | ICD-10-CM

## 2016-12-02 DIAGNOSIS — E7800 Pure hypercholesterolemia, unspecified: Secondary | ICD-10-CM

## 2016-12-02 DIAGNOSIS — I10 Essential (primary) hypertension: Secondary | ICD-10-CM | POA: Diagnosis not present

## 2016-12-02 DIAGNOSIS — Z1159 Encounter for screening for other viral diseases: Secondary | ICD-10-CM | POA: Diagnosis not present

## 2016-12-02 DIAGNOSIS — Z124 Encounter for screening for malignant neoplasm of cervix: Secondary | ICD-10-CM

## 2016-12-02 NOTE — Progress Notes (Signed)
Patient: Kristen Wall, Female    DOB: [birth year], 61 y.o.   MRN: [id number] Visit Date: 12/02/2016  Today's Provider: [name], PA-C   Chief Complaint  Patient presents with  . Annual Exam   Subjective:    Annual physical exam Kristen Wall is a 61 y.o. female who presents today for health maintenance and complete physical. She feels well. She reports exercising none. She reports she is sleeping well.  Last CPE:11/29/15 Mammogram:01/16/16 BI-RADS 1 Colonoscopy: 10/21/07 Normal -----------------------------------------------------------------   Review of Systems  Constitutional: Positive for diaphoresis.  HENT: Positive for dental problem.   Eyes: Negative.   Respiratory: Negative.   Cardiovascular: Negative.   Gastrointestinal: Negative.   Endocrine: Negative.   Genitourinary: Negative.   Musculoskeletal: Negative.   Skin: Negative.   Allergic/Immunologic: Negative.   Neurological: Positive for numbness (in her hands in the am's for year a while-unchanged).  Hematological: Negative.   Psychiatric/Behavioral: Negative.     Social History      She  reports that she quit smoking about 9 years ago. She has never used smokeless tobacco. She reports that she drinks alcohol. She reports that she does not use drugs.       Social History   Social History  . Marital status: Married    Spouse name: [name]  . Number of children: 2  . Years of education: College   Occupational History  . [employer]     Full-Time   Social History Main Topics  . Smoking status: Former Smoker    Quit date: 06/03/2007  . Smokeless tobacco: Never Used  . Alcohol use Yes     Comment: Occasional, Drinks beer for social events.  . Drug use: No  . Sexual activity: Not Asked   Other Topics Concern  . None   Social History Narrative  . None    History reviewed. No pertinent past medical history.   Patient Active Problem List   Diagnosis Date Noted  . Abnormal  glucose 11/29/2015  . Hematuria 11/27/2014  . Screening for colon cancer 11/27/2014  . Allergic rhinitis 10/03/2014  . 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 Carpal tunnel syndrome 10/03/2014  . Closed fracture of foot 10/03/2014  . H/O: glaucoma 10/03/2014  . Burning or prickling sensation 10/03/2014  . BP (high blood pressure) 02/28/2003  . Hypercholesteremia 07/30/2000  . History of tobacco use 11/08/1998    Past Surgical History:  Procedure Laterality Date  . TUBAL LIGATION  1995    Family History        Family Status  Relation Status  . Mother Deceased at age 59  . Father Deceased at age 70  . Brother Deceased at age 82  . MGM Deceased  . Cousin Alive  . Other Alive        Her family history includes Arthritis in her mother; Breast cancer in her cousin; Breast cancer (age of onset: 87) in her other; Coronary artery disease in her mother; Diabetes in her mother; Esophageal cancer in her father; Heart attack in her brother and maternal grandmother; Hypertension in her mother.     No Known Allergies   Current Outpatient Prescriptions:  .  bimatoprost (LUMIGAN) 0.01 % SOLN, Apply to eye., Disp: , Rfl:  .  Cholecalciferol (VITAMIN D) 2000 UNITS tablet, Take by mouth., Disp: , Rfl:  .  lisinopril-hydrochlorothiazide (PRINZIDE,ZESTORETIC) 10-12.5 MG tablet, TAKE ONE TABLET BY MOUTH ONCE DAILY, Disp: 90 tablet, Rfl: 1 .  MULTIPLE VITAMINS PO, Take by mouth., Disp: , Rfl:  .  OMEGA 3-6-9 FATTY ACIDS PO, Take by mouth., Disp: , Rfl:  .  simvastatin (ZOCOR) 20 MG tablet, TAKE ONE TABLET BY MOUTH IN THE EVENING, Disp: 30 tablet, Rfl: 12   Patient Care Team: [name], PA-C as PCP - General (Family Medicine)      Objective:   Vitals: BP 118/72 (BP Location: Left Arm, Patient Position: Sitting, Cuff Size: Normal)   Pulse 71   Temp 98.4 F (36.9 C) (Oral)   Resp 16   Ht 5\' 3"  (1.6 m)   Wt 148 lb 3.2 oz (67.2 kg)   BMI 26.25 kg/m    Physical Exam    Constitutional: She is oriented to person, place, and time. She appears well-developed and well-nourished. No distress.  HENT:  Head: Normocephalic and atraumatic.  Right Ear: Hearing, tympanic membrane, external ear and ear canal normal.  Left Ear: Hearing, tympanic membrane, external ear and ear canal normal.  Nose: Nose normal.  Mouth/Throat: Uvula is midline, oropharynx is clear and moist and mucous membranes are normal. No oropharyngeal exudate.  Eyes: Conjunctivae and EOM are normal. Pupils are equal, round, and reactive to light. Right eye exhibits no discharge. Left eye exhibits no discharge. No scleral icterus.  Neck: Normal range of motion. Neck supple. No JVD present. Carotid bruit is not present. No tracheal deviation present. No thyromegaly present.  Cardiovascular: Normal rate, regular rhythm, normal heart sounds and intact distal pulses.  Exam reveals no gallop and no friction rub.   No murmur heard. Pulmonary/Chest: Effort normal and breath sounds normal. No respiratory distress. She has no wheezes. She has no rales. She exhibits no tenderness. Right breast exhibits no inverted nipple, no mass, no nipple discharge, no skin change and no tenderness. Left breast exhibits no inverted nipple, no mass, no nipple discharge, no skin change and no tenderness. Breasts are symmetrical.  Abdominal: Soft. Bowel sounds are normal. She exhibits no distension and no mass. There is no tenderness. There is no rebound and no guarding. Hernia confirmed negative in the right inguinal area and confirmed negative in the left inguinal area.  Genitourinary: Rectum normal, vagina normal and uterus normal. No breast swelling, tenderness, discharge or bleeding. Pelvic exam was performed with patient supine. There is no rash, tenderness, lesion or injury on the right labia. There is no rash, tenderness, lesion or injury on the left labia. Cervix exhibits no motion tenderness, no discharge and no friability. Right  adnexum displays no mass, no tenderness and no fullness. Left adnexum displays no mass, no tenderness and no fullness. No erythema, tenderness or bleeding in the vagina. No signs of injury around the vagina. No vaginal discharge found.  Musculoskeletal: Normal range of motion. She exhibits no edema or tenderness.  Lymphadenopathy:    She has no cervical adenopathy.       Right: No inguinal adenopathy present.       Left: No inguinal adenopathy present.  Neurological: She is alert and oriented to person, place, and time. She has normal reflexes. No cranial nerve deficit. Coordination normal.  Skin: Skin is warm and dry. No rash noted. She is not diaphoretic.  Psychiatric: She has a normal mood and affect. Her behavior is normal. Judgment and thought content normal.  Vitals reviewed.    Depression Screen PHQ 2/9 Scores 12/02/2016  PHQ - 2 Score 0  PHQ- 9 Score 0      Assessment & Plan:  Routine Health Maintenance and Physical Exam  Exercise Activities and Dietary recommendations Goals    . Exercise 150 minutes per week (moderate activity)    . Reduce portion size          Decrease carbohydrates.        Immunization History  Administered Date(s) Administered  . Td 06/23/1995    Health Maintenance  Topic Date Due  . Hepatitis C Screening  1956-01-23  . HIV Screening  08/30/1971  . TETANUS/TDAP  06/22/2005  . PAP SMEAR  10/12/2015  . INFLUENZA VACCINE  12/31/2016  . COLONOSCOPY  10/20/2017  . MAMMOGRAM  01/15/2018     Discussed health benefits of physical activity, and encouraged her to engage in regular exercise appropriate for her age and condition.    1. Annual physical exam Normal physical exam today. Will check labs as below and f/u pending lab results. If labs are stable and WNL she will not need to have these rechecked for one year at her next annual physical exam. She is to call the office in the meantime if she has any acute issue, questions or  concerns. - CBC with Differential - Comprehensive metabolic panel - Hemoglobin A1c - TSH  2. Breast cancer screening Breast exam today was normal. There is no family history of breast cancer. She does perform regular self breast exams. Mammogram was ordered as below. Information for [facility] clinic was given to patient so she may schedule her mammogram at her convenience. - Mammogram Digital Screening; Future  3. Cervical cancer screening Pap collected today. Will send as below and f/u pending results. - Pap IG and HPV (high risk) DNA detection ([facility])  4. Essential hypertension Stable. Continue lisinopril-HCTZ 10-12.5mg  daily. Will check labs as below and f/u pending results. - CBC with Differential - Comprehensive metabolic panel - Hemoglobin A1c - Lipid panel  5. Hypercholesteremia Stable. Continue simvastatin 20mg . Will check labs as below and f/u pending results. - CBC with Differential - Comprehensive metabolic panel - Hemoglobin A1c - Lipid panel  6. Abnormal glucose Will check labs as below and f/u pending results. - Hemoglobin A1c  7. Need for hepatitis C screening test - Hepatitis C antibody screen  8. Screening for HIV without presence of risk factors - HIV antibody  --------------------------------------------------------------------    [name], PA-C  [facility]

## 2016-12-02 NOTE — Patient Instructions (Signed)

## 2016-12-03 LAB — CBC WITH DIFFERENTIAL/PLATELET
BASOS: 0 %
Basophils Absolute: 0 10*3/uL (ref 0.0–0.2)
EOS (ABSOLUTE): 0.1 10*3/uL (ref 0.0–0.4)
Eos: 1 %
HEMATOCRIT: 40.1 % (ref 34.0–46.6)
Hemoglobin: 13.4 g/dL (ref 11.1–15.9)
Immature Grans (Abs): 0 10*3/uL (ref 0.0–0.1)
Immature Granulocytes: 0 %
Lymphocytes Absolute: 1.6 10*3/uL (ref 0.7–3.1)
Lymphs: 23 %
MCH: 31.3 pg (ref 26.6–33.0)
MCHC: 33.4 g/dL (ref 31.5–35.7)
MCV: 94 fL (ref 79–97)
MONOS ABS: 0.7 10*3/uL (ref 0.1–0.9)
Monocytes: 10 %
NEUTROS ABS: 4.8 10*3/uL (ref 1.4–7.0)
Neutrophils: 66 %
Platelets: 323 10*3/uL (ref 150–379)
RBC: 4.28 x10E6/uL (ref 3.77–5.28)
RDW: 13.2 % (ref 12.3–15.4)
WBC: 7.2 10*3/uL (ref 3.4–10.8)

## 2016-12-03 LAB — LIPID PANEL
CHOL/HDL RATIO: 3.9 ratio (ref 0.0–4.4)
Cholesterol, Total: 239 mg/dL — ABNORMAL HIGH (ref 100–199)
HDL: 61 mg/dL (ref >39)
LDL CALC: 136 mg/dL — AB (ref 0–99)
Triglycerides: 209 mg/dL — ABNORMAL HIGH (ref 0–149)
VLDL CHOLESTEROL CAL: 42 mg/dL — AB (ref 5–40)

## 2016-12-03 LAB — COMPREHENSIVE METABOLIC PANEL
A/G RATIO: 1.7 (ref 1.2–2.2)
ALT: 25 IU/L (ref 0–32)
AST: 19 IU/L (ref 0–40)
Albumin: 4.6 g/dL (ref 3.6–4.8)
Alkaline Phosphatase: 86 IU/L (ref 39–117)
BILIRUBIN TOTAL: 0.3 mg/dL (ref 0.0–1.2)
BUN/Creatinine Ratio: 13 (ref 12–28)
BUN: 6 mg/dL — ABNORMAL LOW (ref 8–27)
CO2: 24 mmol/L (ref 20–29)
Calcium: 9.8 mg/dL (ref 8.7–10.3)
Chloride: 101 mmol/L (ref 96–106)
Creatinine, Ser: 0.48 mg/dL — ABNORMAL LOW (ref 0.57–1.00)
GFR, EST AFRICAN AMERICAN: 123 mL/min/{1.73_m2} (ref >59)
GFR, EST NON AFRICAN AMERICAN: 107 mL/min/{1.73_m2} (ref >59)
Globulin, Total: 2.7 g/dL (ref 1.5–4.5)
Glucose: 97 mg/dL (ref 65–99)
POTASSIUM: 4.6 mmol/L (ref 3.5–5.2)
SODIUM: 141 mmol/L (ref 134–144)
TOTAL PROTEIN: 7.3 g/dL (ref 6.0–8.5)

## 2016-12-03 LAB — HIV ANTIBODY (ROUTINE TESTING W REFLEX): HIV SCREEN 4TH GENERATION: NONREACTIVE

## 2016-12-03 LAB — HEMOGLOBIN A1C
Est. average glucose Bld gHb Est-mCnc: 114 mg/dL
Hgb A1c MFr Bld: 5.6 % (ref 4.8–5.6)

## 2016-12-03 LAB — TSH: TSH: 1.13 u[IU]/mL (ref 0.450–4.500)

## 2016-12-03 LAB — HEPATITIS C ANTIBODY: Hep C Virus Ab: <0.1 s/co ratio (ref 0.0–0.9)

## 2016-12-05 LAB — PAP IG AND HPV HIGH-RISK
HPV, HIGH-RISK: NEGATIVE
PAP SMEAR COMMENT: 0

## 2017-01-19 ENCOUNTER — Ambulatory Visit
Admission: RE | Admit: 2017-01-19 | Discharge: 2017-01-19 | Disposition: A | Discharge status: Home / Self Care | Payer: Managed Care, Other (non HMO) | Source: Ambulatory Visit | Attending: Physician Assistant | Admitting: Physician Assistant

## 2017-01-19 ENCOUNTER — Encounter (INDEPENDENT_AMBULATORY_CARE_PROVIDER_SITE_OTHER): Payer: Self-pay

## 2017-01-19 ENCOUNTER — Telehealth: Payer: Self-pay

## 2017-01-19 DIAGNOSIS — Z1239 Encounter for other screening for malignant neoplasm of breast: Secondary | ICD-10-CM

## 2017-01-19 DIAGNOSIS — Z1231 Encounter for screening mammogram for malignant neoplasm of breast: Secondary | ICD-10-CM | POA: Insufficient documentation

## 2017-01-19 NOTE — Telephone Encounter (Signed)
-----   Message from [name], [place] sent at 01/19/2017  1:36 PM EDT ----- Normal mammogram. Repeat screening in one year.

## 2017-01-19 NOTE — Telephone Encounter (Signed)
LMTCB  Thanks,  -Joseline 

## 2017-01-20 NOTE — Telephone Encounter (Signed)
Tried calling patient. Left message to call back. 

## 2017-01-22 ENCOUNTER — Telehealth: Payer: Self-pay | Admitting: Physician Assistant

## 2017-01-22 NOTE — Telephone Encounter (Signed)
Patient advised as directed below.  Thanks,  -Joseline 

## 2017-01-22 NOTE — Telephone Encounter (Signed)
lmtcb

## 2017-02-02 ENCOUNTER — Other Ambulatory Visit: Payer: Self-pay | Admitting: Family Medicine

## 2017-02-02 DIAGNOSIS — E7800 Pure hypercholesterolemia, unspecified: Secondary | ICD-10-CM

## 2017-02-26 ENCOUNTER — Encounter: Payer: Self-pay | Admitting: Physician Assistant

## 2017-02-26 ENCOUNTER — Ambulatory Visit (INDEPENDENT_AMBULATORY_CARE_PROVIDER_SITE_OTHER): Payer: Managed Care, Other (non HMO) | Admitting: Physician Assistant

## 2017-02-26 VITALS — BP 114/70 | HR 76 | Temp 98.7°F | Resp 16 | Wt 149.0 lb

## 2017-02-26 DIAGNOSIS — K1121 Acute sialoadenitis: Secondary | ICD-10-CM | POA: Diagnosis not present

## 2017-02-26 NOTE — Progress Notes (Addendum)
Patient: Kristen Wall Female    DOB: 1955/10/24   61 y.o.   MRN: [id number] Visit Date: 02/26/2017  Today's Provider: Trinna Post, PA-C   Chief Complaint  Patient presents with  . Facial Swelling   Subjective:    HPI   Pt comes in today complaining of right side facial swelling.  She first noticed it yesterday morning.  She reports having a sore throat yesterday but that has since improved.  She denies fevers, sinus pain/pressure, ear pain. She denies feeling ill. She says when she eats, the swelling gets worse. She denies pain or redness. She has a 34 pack year smoking history, has since quit.    No Known Allergies   Current Outpatient Prescriptions:  .  bimatoprost (LUMIGAN) 0.01 % SOLN, Apply to eye., Disp: , Rfl:  .  Cholecalciferol (VITAMIN D) 2000 UNITS tablet, Take by mouth., Disp: , Rfl:  .  lisinopril-hydrochlorothiazide (PRINZIDE,ZESTORETIC) 10-12.5 MG tablet, TAKE ONE TABLET BY MOUTH ONCE DAILY, Disp: 90 tablet, Rfl: 1 .  MULTIPLE VITAMINS PO, Take by mouth., Disp: , Rfl:  .  OMEGA 3-6-9 FATTY ACIDS PO, Take by mouth., Disp: , Rfl:  .  simvastatin (ZOCOR) 20 MG tablet, TAKE ONE TABLET BY MOUTH IN THE EVENING, Disp: 30 tablet, Rfl: 12  Review of Systems  Constitutional: Negative.   HENT: Positive for sore throat (Only yesterday but has improved today.).   Eyes: Negative.   Neurological: Negative for dizziness, light-headedness and headaches.    Social History  Substance Use Topics  . Smoking status: Former Smoker    Quit date: 06/03/2007  . Smokeless tobacco: Never Used  . Alcohol use Yes     Comment: Occasional, Drinks beer for social events.   Objective:   BP 114/70 (BP Location: Left Arm, Patient Position: Sitting, Cuff Size: Normal)   Pulse 76   Temp 98.7 F (37.1 C) (Oral)   Resp 16   Wt 149 lb (67.6 kg)   BMI 26.39 kg/m  Vitals:   02/26/17 1641  BP: 114/70  Pulse: 76  Resp: 16  Temp: 98.7 F (37.1 C)  TempSrc: Oral  Weight: 149  lb (67.6 kg)     Physical Exam  Constitutional: She appears well-developed and well-nourished.  HENT:  Right Ear: External ear normal.  Left Ear: External ear normal.  Mouth/Throat: Oropharynx is clear and moist. No oropharyngeal exudate.  Neck: No tracheal deviation present.  Marked swelling in right sided parotid distribution. Nontender to palpation and no cervical adenopathy.   Cardiovascular: Normal rate and regular rhythm.   Pulmonary/Chest: Effort normal and breath sounds normal. No respiratory distress. She has no wheezes. She has no rales.  Lymphadenopathy:    She has no cervical adenopathy.        Assessment & Plan:     1. Parotitis, acute  Have counseled patient on course of parotitis. Should do sour suckers to encourage salivation and removal of blockage. Can do warm compresses. If not improving within a week or so, should get imaging 2/2 smoking history.  Return if symptoms worsen or fail to improve.  The entirety of the information documented in the History of Present Illness, Review of Systems and Physical Exam were personally obtained by me. Portions of this information were initially documented by Ashley Royalty, CMA and reviewed by me for thoroughness and accuracy.          Trinna Post, PA-C  [facility]

## 2017-02-26 NOTE — Patient Instructions (Signed)
Parotitis Parotitis means that you have irritation and swelling (inflammation) in one or both of your parotid glands. These glands make spit (saliva). They are found on each side of your face, below and in front of your earlobes. You may or may not have pain with this condition. Follow these instructions at home: Medicines  Take over-the-counter and prescription medicines only as told by your doctor.  If you were prescribed an antibiotic medicine, take it as told by your doctor. Do not stop taking the antibiotic even if you start to feel better. Managing pain and swelling  Apply warm cloths (compresses) to the swollen area as told by your doctor.  Gently rub your parotid glands as told by your doctor. General instructions   Drink enough fluid to keep your pee (urine) clear or pale yellow.  Suck on sour candy. This may help: ? To make your mouth less dry. ? To make more spit.  Keep your mouth clean and moist. ? Gargle with a salt-water mixture 3-4 times per day, or as needed. ? To make a salt-water mixture, stir -1 tsp of salt into 1 cup of warm water.  Take good care of your mouth: ? Brush your teeth at least two times per day. ? Floss your teeth every day. ? See your dentist regularly.  Do not use tobacco products. These include cigarettes, chewing tobacco, or e-cigarettes. If you need help quitting,  ask your doctor.  Keep all follow-up visits as told by your doctor. This is important. Contact a doctor if:  You have a fever or chills.  You have new symptoms.  Your symptoms get worse.  Your symptoms do not get better with treatment. This information is not intended to replace advice given to you by your health care provider. Make sure you discuss any questions you have with your health care provider. Document Released: 06/21/2010 Document Revised: 10/25/2015 Document Reviewed: 10/12/2014 Elsevier Interactive Patient Education  2018 Elsevier Inc.  

## 2017-05-21 NOTE — Telephone Encounter (Signed)
error/MW

## 2017-12-02 ENCOUNTER — Ambulatory Visit (INDEPENDENT_AMBULATORY_CARE_PROVIDER_SITE_OTHER): Payer: Managed Care, Other (non HMO) | Admitting: Physician Assistant

## 2017-12-02 ENCOUNTER — Encounter: Payer: Self-pay | Admitting: Physician Assistant

## 2017-12-02 VITALS — BP 130/90 | HR 68 | Temp 98.9°F | Resp 16 | Ht 63.0 in | Wt 154.0 lb

## 2017-12-02 DIAGNOSIS — E7800 Pure hypercholesterolemia, unspecified: Secondary | ICD-10-CM | POA: Diagnosis not present

## 2017-12-02 DIAGNOSIS — Z23 Encounter for immunization: Secondary | ICD-10-CM

## 2017-12-02 DIAGNOSIS — Z0000 Encounter for general adult medical examination without abnormal findings: Secondary | ICD-10-CM | POA: Diagnosis not present

## 2017-12-02 DIAGNOSIS — Z1211 Encounter for screening for malignant neoplasm of colon: Secondary | ICD-10-CM | POA: Diagnosis not present

## 2017-12-02 DIAGNOSIS — Z1231 Encounter for screening mammogram for malignant neoplasm of breast: Secondary | ICD-10-CM

## 2017-12-02 DIAGNOSIS — R7309 Other abnormal glucose: Secondary | ICD-10-CM

## 2017-12-02 DIAGNOSIS — I10 Essential (primary) hypertension: Secondary | ICD-10-CM | POA: Diagnosis not present

## 2017-12-02 DIAGNOSIS — Z1239 Encounter for other screening for malignant neoplasm of breast: Secondary | ICD-10-CM

## 2017-12-02 MED ORDER — SIMVASTATIN 20 MG PO TABS: 20.0000 mg | ORAL_TABLET | Freq: Every evening | ORAL | 3 refills | Status: DC

## 2017-12-02 MED ORDER — LISINOPRIL-HYDROCHLOROTHIAZIDE 10-12.5 MG PO TABS: 1.0000 | ORAL_TABLET | Freq: Every day | ORAL | 3 refills | Status: DC

## 2017-12-02 NOTE — Patient Instructions (Signed)

## 2017-12-02 NOTE — Progress Notes (Signed)
Patient: Kristen Wall, Female    DOB: 1955/09/13, 62 y.o.   MRN: [id number] Visit Date: 12/02/2017  Today's Provider: [name], PA-C   Chief Complaint  Patient presents with  . Annual Exam   Subjective:    Annual physical exam Kristen Wall is a 62 y.o. female who presents today for health maintenance and complete physical. She feels well. She reports exercising none. She reports she is sleeping well.  12/02/16 CPE 12/02/16 Pap-neg; HPV-neg 01/19/17 Mammogram-BIRADS 1 10/21/07 Colonoscopy-normal -----------------------------------------------------------------   Review of Systems  Constitutional: Negative.   HENT: Negative.   Eyes: Negative.   Respiratory: Negative.   Cardiovascular: Negative.   Gastrointestinal: Negative.   Endocrine: Negative.   Genitourinary: Negative.   Musculoskeletal: Negative.   Skin: Negative.   Allergic/Immunologic: Negative.   Neurological: Negative.   Hematological: Negative.   Psychiatric/Behavioral: Negative.     Social History      She  reports that she quit smoking about 10 years ago. She has never used smokeless tobacco. She reports that she drinks alcohol. She reports that she does not use drugs.       Social History   Socioeconomic History  . Marital status: Married    Spouse name: [name]  . Number of children: 2  . Years of education: College  . Highest education level: Not on file  Occupational History  . Occupation: [name]    Comment: Full-Time  Social Needs  . Financial resource strain: Not on file  . Food insecurity:    Worry: Not on file    Inability: Not on file  . Transportation needs:    Medical: Not on file    Non-medical: Not on file  Tobacco Use  . Smoking status: Former Smoker    Last attempt to quit: 06/03/2007    Years since quitting: 10.5  . Smokeless tobacco: Never Used  Substance and Sexual Activity  . Alcohol use: Yes    Comment: Occasional, Drinks beer for social events.    . Drug use: No  . Sexual activity: Not on file  Lifestyle  . Physical activity:    Days per week: Not on file    Minutes per session: Not on file  . Stress: Not on file  Relationships  . Social connections:    Talks on phone: Not on file    Gets together: Not on file    Attends religious service: Not on file    Active member of club or organization: Not on file    Attends meetings of clubs or organizations: Not on file    Relationship status: Not on file  Other Topics Concern  . Not on file  Social History Narrative  . Not on file    No past medical history on file.   Patient Active Problem List   Diagnosis Date Noted  . Abnormal glucose 11/29/2015  . Hematuria 11/27/2014  . Allergic rhinitis 10/03/2014  . 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 Carpal tunnel syndrome 10/03/2014  . Closed fracture of foot 10/03/2014  . H/O: glaucoma 10/03/2014  . Burning or prickling sensation 10/03/2014  . BP (high blood pressure) 02/28/2003  . Hypercholesteremia 07/30/2000  . History of tobacco use 11/08/1998    Past Surgical History:  Procedure Laterality Date  . TUBAL LIGATION  1995    Family History        Family Status  Relation Name Status  . Mother  Deceased at age 94  .  Father  Deceased at age 75  . Brother  Deceased at age 93  . MGM  Deceased  . Cousin  Alive  . Other neice Alive        Her family history includes Arthritis in her mother; Breast cancer in her cousin; Breast cancer (age of onset: 40) in her other; Coronary artery disease in her mother; Diabetes in her mother; Esophageal cancer in her father; Heart attack in her brother and maternal grandmother; Hypertension in her mother.      No Known Allergies   Current Outpatient Medications:  .  bimatoprost (LUMIGAN) 0.01 % SOLN, Apply to eye., Disp: , Rfl:  .  Cholecalciferol (VITAMIN D) 2000 UNITS tablet, Take by mouth., Disp: , Rfl:  .  latanoprost (XALATAN) 0.005 % ophthalmic solution, INSTILL 1 DROP INTO  EACH EYE AT BEDTIME, Disp: , Rfl: 5 .  lisinopril-hydrochlorothiazide (PRINZIDE,ZESTORETIC) 10-12.5 MG tablet, TAKE ONE TABLET BY MOUTH ONCE DAILY, Disp: 90 tablet, Rfl: 1 .  MULTIPLE VITAMINS PO, Take by mouth., Disp: , Rfl:  .  OMEGA 3-6-9 FATTY ACIDS PO, Take by mouth., Disp: , Rfl:  .  simvastatin (ZOCOR) 20 MG tablet, TAKE ONE TABLET BY MOUTH IN THE EVENING, Disp: 30 tablet, Rfl: 12   Patient Care Team: [name], PA-C as PCP - General (Family Medicine)      Objective:   Vitals: BP 130/90 (BP Location: Left Arm, Patient Position: Sitting, Cuff Size: Normal)   Pulse 68   Temp 98.9 F (37.2 C) (Oral)   Resp 16   Ht 5\' 3"  (1.6 m)   Wt 154 lb (69.9 kg)   SpO2 98%   BMI 27.28 kg/m    Vitals:   12/02/17 0917  BP: 130/90  Pulse: 68  Resp: 16  Temp: 98.9 F (37.2 C)  TempSrc: Oral  SpO2: 98%  Weight: 154 lb (69.9 kg)  Height: 5\' 3"  (1.6 m)     Physical Exam  Constitutional: She is oriented to person, place, and time. She appears well-developed and well-nourished. No distress.  HENT:  Head: Normocephalic and atraumatic.  Right Ear: Hearing, tympanic membrane, external ear and ear canal normal.  Left Ear: Hearing, tympanic membrane, external ear and ear canal normal.  Nose: Nose normal.  Mouth/Throat: Uvula is midline, oropharynx is clear and moist and mucous membranes are normal. No oropharyngeal exudate.  Eyes: Pupils are equal, round, and reactive to light. Conjunctivae and EOM are normal. Right eye exhibits no discharge. Left eye exhibits no discharge. No scleral icterus.  Neck: Normal range of motion. Neck supple. No JVD present. Carotid bruit is not present. No tracheal deviation present. No thyromegaly present.  Cardiovascular: Normal rate, regular rhythm, normal heart sounds and intact distal pulses. Exam reveals no gallop and no friction rub.  No murmur heard. Pulmonary/Chest: Effort normal and breath sounds normal. No respiratory distress. She has no  wheezes. She has no rales. She exhibits no tenderness. Right breast exhibits no inverted nipple, no mass, no nipple discharge, no skin change and no tenderness. Left breast exhibits no inverted nipple, no mass, no nipple discharge, no skin change and no tenderness. No breast swelling, tenderness, discharge or bleeding. Breasts are symmetrical.  Abdominal: Soft. Bowel sounds are normal. She exhibits no distension and no mass. There is no tenderness. There is no rebound and no guarding.  Musculoskeletal: Normal range of motion. She exhibits no edema or tenderness.  Lymphadenopathy:    She has no cervical adenopathy.  Neurological: She is  alert and oriented to person, place, and time.  Skin: Skin is warm and dry. No rash noted. She is not diaphoretic.  Psychiatric: She has a normal mood and affect. Her behavior is normal. Judgment and thought content normal.  Vitals reviewed.    Depression Screen PHQ 2/9 Scores 12/02/2016  PHQ - 2 Score 0  PHQ- 9 Score 0      Assessment & Plan:     Routine Health Maintenance and Physical Exam  Exercise Activities and Dietary recommendations Goals    . Exercise 150 minutes per week (moderate activity)    . Reduce portion size     Decrease carbohydrates.        Immunization History  Administered Date(s) Administered  . Td 06/23/1995    Health Maintenance  Topic Date Due  . TETANUS/TDAP  06/22/2005  . COLONOSCOPY  10/20/2017  . INFLUENZA VACCINE  12/31/2017  . MAMMOGRAM  01/20/2019  . PAP SMEAR  12/03/2019  . Hepatitis C Screening  Completed  . HIV Screening  Completed     Discussed health benefits of physical activity, and encouraged her to engage in regular exercise appropriate for her age and condition.    1. Annual physical exam Normal physical exam today. Will check labs as below and f/u pending lab results. If labs are stable and WNL she will not need to have these rechecked for one year at her next annual physical exam. She is to  call the office in the meantime if she has any acute issue, questions or concerns.  2. Breast cancer screening Breast exam today was normal. There is no family history of breast cancer. She does perform regular self breast exams. Mammogram was ordered as below. Information for [facility] clinic was given to patient so she may schedule her mammogram at her convenience. - MM DIGITAL SCREENING BILATERAL; Future  3. Colon cancer screening - Ambulatory referral to Gastroenterology  4. Essential hypertension Stable. Diagnosis pulled for medication refill. Continue current medical treatment plan. Will check labs as below and f/u pending results. - CBC with Differential/Platelet - Comprehensive metabolic panel - TSH - lisinopril-hydrochlorothiazide (PRINZIDE,ZESTORETIC) 10-12.5 MG tablet; Take 1 tablet by mouth daily.  Dispense: 90 tablet; Refill: 3  5. Hypercholesteremia Stable. Diagnosis pulled for medication refill. Continue current medical treatment plan. Will check labs as below and f/u pending results. - Lipid Panel With LDL/HDL Ratio - TSH - simvastatin (ZOCOR) 20 MG tablet; Take 1 tablet (20 mg total) by mouth every evening.  Dispense: 90 tablet; Refill: 3  6. Abnormal glucose Will check labs as below and f/u pending results. - TSH - Hemoglobin A1c  7. Need for Td vaccine Td booster Vaccine given to patient without complications. Patient sat for 15 minutes after administration and was tolerated well without adverse effects. - Td vaccine greater than or equal to 7yo preservative free IM  --------------------------------------------------------------------    [name], PA-C  [facility]

## 2017-12-03 LAB — CBC WITH DIFFERENTIAL/PLATELET
BASOS ABS: 0 10*3/uL (ref 0.0–0.2)
Basos: 0 %
EOS (ABSOLUTE): 0.1 10*3/uL (ref 0.0–0.4)
EOS: 1 %
HEMOGLOBIN: 13.7 g/dL (ref 11.1–15.9)
Hematocrit: 41.6 % (ref 34.0–46.6)
IMMATURE GRANS (ABS): 0 10*3/uL (ref 0.0–0.1)
IMMATURE GRANULOCYTES: 0 %
Lymphocytes Absolute: 1.2 10*3/uL (ref 0.7–3.1)
Lymphs: 15 %
MCH: 31.6 pg (ref 26.6–33.0)
MCHC: 32.9 g/dL (ref 31.5–35.7)
MCV: 96 fL (ref 79–97)
MONOCYTES: 8 %
Monocytes Absolute: 0.6 10*3/uL (ref 0.1–0.9)
Neutrophils Absolute: 6.1 10*3/uL (ref 1.4–7.0)
Neutrophils: 76 %
PLATELETS: 272 10*3/uL (ref 150–450)
RBC: 4.33 x10E6/uL (ref 3.77–5.28)
RDW: 12.2 % — ABNORMAL LOW (ref 12.3–15.4)
WBC: 7.9 10*3/uL (ref 3.4–10.8)

## 2017-12-03 LAB — COMPREHENSIVE METABOLIC PANEL
ALBUMIN: 4.6 g/dL (ref 3.6–4.8)
ALT: 27 IU/L (ref 0–32)
AST: 26 IU/L (ref 0–40)
Albumin/Globulin Ratio: 1.6 (ref 1.2–2.2)
Alkaline Phosphatase: 68 IU/L (ref 39–117)
BUN / CREAT RATIO: 14 (ref 12–28)
BUN: 7 mg/dL — AB (ref 8–27)
Bilirubin Total: 0.2 mg/dL (ref 0.0–1.2)
CHLORIDE: 104 mmol/L (ref 96–106)
CO2: 24 mmol/L (ref 20–29)
CREATININE: 0.5 mg/dL — AB (ref 0.57–1.00)
Calcium: 9.7 mg/dL (ref 8.7–10.3)
GFR calc non Af Amer: 105 mL/min/{1.73_m2} (ref >59)
GFR, EST AFRICAN AMERICAN: 121 mL/min/{1.73_m2} (ref >59)
GLUCOSE: 93 mg/dL (ref 65–99)
Globulin, Total: 2.8 g/dL (ref 1.5–4.5)
Potassium: 4.4 mmol/L (ref 3.5–5.2)
Sodium: 144 mmol/L (ref 134–144)
TOTAL PROTEIN: 7.4 g/dL (ref 6.0–8.5)

## 2017-12-03 LAB — LIPID PANEL WITH LDL/HDL RATIO
CHOLESTEROL TOTAL: 200 mg/dL — AB (ref 100–199)
HDL: 59 mg/dL (ref >39)
LDL Calculated: 73 mg/dL (ref 0–99)
LDl/HDL Ratio: 1.2 ratio (ref 0.0–3.2)
TRIGLYCERIDES: 342 mg/dL — AB (ref 0–149)
VLDL CHOLESTEROL CAL: 68 mg/dL — AB (ref 5–40)

## 2017-12-03 LAB — HEMOGLOBIN A1C
Est. average glucose Bld gHb Est-mCnc: 114 mg/dL
Hgb A1c MFr Bld: 5.6 % (ref 4.8–5.6)

## 2017-12-03 LAB — TSH: TSH: 0.825 u[IU]/mL (ref 0.450–4.500)

## 2017-12-04 ENCOUNTER — Telehealth: Payer: Self-pay

## 2017-12-04 NOTE — Telephone Encounter (Signed)
-----   Message from [name], [place] sent at 12/04/2017  3:35 PM EDT ----- Blood count is normal. Kidney and liver function are normal. Sugar normal. Cholesterol still borderline high but improved from last year but triglycerides were elevated. This is because it was a non-fasting lab. Just remember to limit fatty foods and red meats in diet. Thyroid is normal. A1c normal.

## 2017-12-04 NOTE — Telephone Encounter (Signed)
Patient advised as directed below.  Thanks,  -Albin Duckett 

## 2017-12-17 ENCOUNTER — Encounter: Payer: Self-pay | Admitting: *Deleted

## 2018-02-02 ENCOUNTER — Ambulatory Visit
Admission: RE | Admit: 2018-02-02 | Discharge: 2018-02-02 | Disposition: A | Discharge status: Home / Self Care | Payer: Managed Care, Other (non HMO) | Source: Ambulatory Visit | Attending: Physician Assistant | Admitting: Physician Assistant

## 2018-02-02 ENCOUNTER — Encounter (INDEPENDENT_AMBULATORY_CARE_PROVIDER_SITE_OTHER): Payer: Self-pay

## 2018-02-02 DIAGNOSIS — Z1231 Encounter for screening mammogram for malignant neoplasm of breast: Secondary | ICD-10-CM | POA: Insufficient documentation

## 2018-02-02 DIAGNOSIS — Z1239 Encounter for other screening for malignant neoplasm of breast: Secondary | ICD-10-CM

## 2018-02-04 ENCOUNTER — Telehealth: Payer: Self-pay

## 2018-02-04 NOTE — Telephone Encounter (Signed)
Patient advised as below.  

## 2018-02-04 NOTE — Telephone Encounter (Signed)
-----   Message from [name], PA-C sent at 02/03/2018  9:47 PM EDT ----- Normal mammogram. Repeat in one year.

## 2018-05-12 ENCOUNTER — Telehealth: Payer: Self-pay | Admitting: Physician Assistant

## 2018-05-12 DIAGNOSIS — Z1211 Encounter for screening for malignant neoplasm of colon: Secondary | ICD-10-CM

## 2018-05-12 NOTE — Telephone Encounter (Signed)
Pt wanting to come by to pick up colon test kit.  Pt not sure which one she is needing. Wanting to go ahead with testing.  Please advise.  Thanks, [employer]

## 2018-05-12 NOTE — Telephone Encounter (Signed)
Patient advised as below.  

## 2018-05-12 NOTE — Telephone Encounter (Signed)
She does not have to pick it up. It will be mailed to her house. I will place cologuard test order now.

## 2018-05-17 ENCOUNTER — Other Ambulatory Visit: Payer: Self-pay | Admitting: Physician Assistant

## 2018-05-17 DIAGNOSIS — I10 Essential (primary) hypertension: Secondary | ICD-10-CM

## 2018-05-17 MED ORDER — LISINOPRIL-HYDROCHLOROTHIAZIDE 10-12.5 MG PO TABS: 1.0000 | ORAL_TABLET | Freq: Every day | ORAL | 3 refills | Status: DC

## 2018-05-17 NOTE — Telephone Encounter (Signed)
Pt needing a refill on:  lisinopril-hydrochlorothiazide (PRINZIDE,ZESTORETIC) 10-12.5 MG tablet  Please fill at:   [facility] [place] - [address] [phone] (Phone) [phone] (Fax)   Thanks, [employer]

## 2018-06-14 LAB — COLOGUARD: Cologuard: NEGATIVE

## 2018-06-16 ENCOUNTER — Telehealth: Payer: Self-pay

## 2018-06-16 NOTE — Telephone Encounter (Signed)
Patient advised.KW 

## 2018-06-16 NOTE — Telephone Encounter (Signed)
-----   Message from [name], PA-C sent at 06/16/2018  8:03 AM EST ----- Cologuard negative. Can repeat in 3 years.

## 2018-12-07 ENCOUNTER — Encounter: Payer: Managed Care, Other (non HMO) | Admitting: Family Medicine

## 2018-12-09 ENCOUNTER — Ambulatory Visit (INDEPENDENT_AMBULATORY_CARE_PROVIDER_SITE_OTHER): Payer: Managed Care, Other (non HMO) | Admitting: Physician Assistant

## 2018-12-09 ENCOUNTER — Encounter: Payer: Self-pay | Admitting: Physician Assistant

## 2018-12-09 ENCOUNTER — Other Ambulatory Visit: Payer: Self-pay

## 2018-12-09 VITALS — BP 184/99 | HR 63 | Temp 98.4°F | Resp 16 | Ht 63.0 in | Wt 154.0 lb

## 2018-12-09 DIAGNOSIS — R7309 Other abnormal glucose: Secondary | ICD-10-CM

## 2018-12-09 DIAGNOSIS — E7800 Pure hypercholesterolemia, unspecified: Secondary | ICD-10-CM

## 2018-12-09 DIAGNOSIS — Z1239 Encounter for other screening for malignant neoplasm of breast: Secondary | ICD-10-CM

## 2018-12-09 DIAGNOSIS — Z87891 Personal history of nicotine dependence: Secondary | ICD-10-CM

## 2018-12-09 DIAGNOSIS — Z0000 Encounter for general adult medical examination without abnormal findings: Secondary | ICD-10-CM

## 2018-12-09 DIAGNOSIS — I10 Essential (primary) hypertension: Secondary | ICD-10-CM | POA: Diagnosis not present

## 2018-12-09 MED ORDER — SIMVASTATIN 20 MG PO TABS: 20.0000 mg | ORAL_TABLET | Freq: Every evening | ORAL | 3 refills | Status: DC

## 2018-12-09 MED ORDER — LISINOPRIL-HYDROCHLOROTHIAZIDE 10-12.5 MG PO TABS: 1.0000 | ORAL_TABLET | Freq: Two times a day (BID) | ORAL | 1 refills | Status: DC

## 2018-12-09 NOTE — Progress Notes (Signed)
Patient: Kristen Wall, Female    DOB: 1955-09-27, 63 y.o.   MRN: [id number] Visit Date: 12/09/2018  Today's Provider: [name], PA-C   Chief Complaint  Patient presents with  . Annual Exam   Subjective:     Annual physical exam Kristen Wall is a 63 y.o. female who presents today for health maintenance and complete physical. She feels well. She reports exercising none. She reports she is sleeping well. -----------------------------------------------------------------   Review of Systems  Constitutional: Negative.   HENT: Negative.   Eyes: Negative.   Respiratory: Negative.   Cardiovascular: Negative.   Gastrointestinal: Negative.   Endocrine: Negative.   Genitourinary: Negative.   Musculoskeletal: Negative.   Skin: Negative.   Allergic/Immunologic: Negative.   Neurological: Negative.   Hematological: Negative.   Psychiatric/Behavioral: Negative.     Social History      She  reports that she quit smoking about 11 years ago. She has never used smokeless tobacco. She reports current alcohol use. She reports that she does not use drugs.       Social History   Socioeconomic History  . Marital status: Married    Spouse name: [name]  . Number of children: 2  . Years of education: College  . Highest education level: Not on file  Occupational History  . Occupation: [name]    Comment: Full-Time  Social Needs  . Financial resource strain: Not on file  . Food insecurity    Worry: Not on file    Inability: Not on file  . Transportation needs    Medical: Not on file    Non-medical: Not on file  Tobacco Use  . Smoking status: Former Smoker    Quit date: 06/03/2007    Years since quitting: 11.5  . Smokeless tobacco: Never Used  Substance and Sexual Activity  . Alcohol use: Yes    Comment: Occasional, Drinks beer for social events.  . Drug use: No  . Sexual activity: Not on file  Lifestyle  . Physical activity    Days per week: Not on file   Minutes per session: Not on file  . Stress: Not on file  Relationships  . Social Herbalist on phone: Not on file    Gets together: Not on file    Attends religious service: Not on file    Active member of club or organization: Not on file    Attends meetings of clubs or organizations: Not on file    Relationship status: Not on file  Other Topics Concern  . Not on file  Social History Narrative  . Not on file    History reviewed. No pertinent past medical history.   Patient Active Problem List   Diagnosis Date Noted  . Abnormal glucose 11/29/2015  . Hematuria 11/27/2014  . Allergic rhinitis 10/03/2014  . 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 Carpal tunnel syndrome 10/03/2014  . Closed fracture of foot 10/03/2014  . H/O: glaucoma 10/03/2014  . Burning or prickling sensation 10/03/2014  . BP (high blood pressure) 02/28/2003  . Hypercholesteremia 07/30/2000  . History of tobacco use 11/08/1998    Past Surgical History:  Procedure Laterality Date  . TUBAL LIGATION  1995    Family History        Family Status  Relation Name Status  . Mother  Deceased at age 36  . Father  Deceased at age 68  . Brother  Deceased at age 32  .  MGM  Deceased  . Cousin  Alive  . Other neice Alive        Her family history includes Arthritis in her mother; Breast cancer in her cousin; Breast cancer (age of onset: 85) in an other family member; Coronary artery disease in her mother; Diabetes in her mother; Esophageal cancer in her father; Heart attack in her brother and maternal grandmother; Hypertension in her mother.      No Known Allergies   Current Outpatient Medications:  .  bimatoprost (LUMIGAN) 0.01 % SOLN, Apply to eye., Disp: , Rfl:  .  Cholecalciferol (VITAMIN D) 2000 UNITS tablet, Take by mouth., Disp: , Rfl:  .  latanoprost (XALATAN) 0.005 % ophthalmic solution, INSTILL 1 DROP INTO EACH EYE AT BEDTIME, Disp: , Rfl: 5 .  lisinopril-hydrochlorothiazide  (PRINZIDE,ZESTORETIC) 10-12.5 MG tablet, Take 1 tablet by mouth daily., Disp: 90 tablet, Rfl: 3 .  MULTIPLE VITAMINS PO, Take by mouth., Disp: , Rfl:  .  OMEGA 3-6-9 FATTY ACIDS PO, Take by mouth., Disp: , Rfl:  .  simvastatin (ZOCOR) 20 MG tablet, Take 1 tablet (20 mg total) by mouth every evening., Disp: 90 tablet, Rfl: 3   Patient Care Team: [name], PA-C as PCP - General (Family Medicine)    Objective:    Vitals: BP (!) 175/84 (BP Location: Left Arm, Patient Position: Sitting, Cuff Size: Large)   Pulse 73   Temp 98.4 F (36.9 C) (Oral)   Resp 16   Ht 5\' 3"  (1.6 m)   Wt 154 lb (69.9 kg)   SpO2 97%   BMI 27.28 kg/m    Vitals:   12/09/18 1452  BP: (!) 175/84  Pulse: 73  Resp: 16  Temp: 98.4 F (36.9 C)  TempSrc: Oral  SpO2: 97%  Weight: 154 lb (69.9 kg)  Height: 5\' 3"  (1.6 m)     Physical Exam Vitals signs reviewed.  Constitutional:      General: She is not in acute distress.    Appearance: Normal appearance. She is well-developed and normal weight. She is not ill-appearing or diaphoretic.  HENT:     Head: Normocephalic and atraumatic.     Right Ear: Tympanic membrane, ear canal and external ear normal.     Left Ear: Tympanic membrane, ear canal and external ear normal.     Nose: Nose normal.     Mouth/Throat:     Mouth: Mucous membranes are moist.     Pharynx: No oropharyngeal exudate or posterior oropharyngeal erythema.  Eyes:     General: No scleral icterus.       Right eye: No discharge.        Left eye: No discharge.     Extraocular Movements: Extraocular movements intact.     Conjunctiva/sclera: Conjunctivae normal.     Pupils: Pupils are equal, round, and reactive to light.  Neck:     Musculoskeletal: Normal range of motion and neck supple.     Thyroid: No thyromegaly.     Vascular: No carotid bruit or JVD.     Trachea: No tracheal deviation.  Cardiovascular:     Rate and Rhythm: Normal rate and regular rhythm.     Pulses: Normal  pulses.     Heart sounds: Normal heart sounds. No murmur. No friction rub. No gallop.   Pulmonary:     Effort: Pulmonary effort is normal. No respiratory distress.     Breath sounds: Normal breath sounds. No wheezing or rales.  Chest:  Chest wall: No tenderness.  Abdominal:     General: Abdomen is flat. Bowel sounds are normal. There is no distension.     Palpations: Abdomen is soft. There is no mass.     Tenderness: There is no abdominal tenderness. There is no guarding or rebound.  Musculoskeletal: Normal range of motion.        General: No tenderness.  Lymphadenopathy:     Cervical: No cervical adenopathy.  Skin:    General: Skin is warm and dry.     Capillary Refill: Capillary refill takes less than 2 seconds.     Findings: No rash.  Neurological:     General: No focal deficit present.     Mental Status: She is alert and oriented to person, place, and time. Mental status is at baseline.     Cranial Nerves: No cranial nerve deficit.     Motor: No weakness.     Coordination: Coordination normal.     Gait: Gait normal.  Psychiatric:        Mood and Affect: Mood normal.        Behavior: Behavior normal.        Thought Content: Thought content normal.        Judgment: Judgment normal.      Depression Screen PHQ 2/9 Scores 12/09/2018 12/02/2017 12/02/2016  PHQ - 2 Score 0 0 0  PHQ- 9 Score 0 1 0       Assessment & Plan:     Routine Health Maintenance and Physical Exam  Exercise Activities and Dietary recommendations Goals    . Exercise 150 minutes per week (moderate activity)    . Reduce portion size     Decrease carbohydrates.        Immunization History  Administered Date(s) Administered  . Influenza, High Dose Seasonal PF 03/03/2017  . Td 06/23/1995, 12/02/2017    Health Maintenance  Topic Date Due  . INFLUENZA VACCINE  01/01/2019  . PAP SMEAR-Modifier  12/03/2019  . MAMMOGRAM  02/03/2020  . Fecal DNA (Cologuard)  06/09/2021  . TETANUS/TDAP   12/03/2027  . Hepatitis C Screening  Completed  . HIV Screening  Completed     Discussed health benefits of physical activity, and encouraged her to engage in regular exercise appropriate for her age and condition.    1. Annual physical exam Normal physical exam today. Will check labs as below and f/u pending lab results. If labs are stable and WNL she will not need to have these rechecked for one year at her next annual physical exam. She is to call the office in the meantime if she has any acute issue, questions or concerns. - TSH  2. Breast cancer screening Breast exam today was normal. There is no family history of breast cancer. She does perform regular self breast exams. Mammogram was ordered as below. Information for [facility] was given to patient so she may schedule her mammogram at her convenience. - MM 3D SCREEN BREAST BILATERAL; Future  3. Essential hypertension Elevated today. Diagnosis pulled for medication refill. Increase to BID dosing. Will check labs as below and f/u pending results. - CBC w/Diff/Platelet - Comprehensive Metabolic Panel (CMET) - TSH - Lipid Profile - HgB A1c - lisinopril-hydrochlorothiazide (ZESTORETIC) 10-12.5 MG tablet; Take 1 tablet by mouth 2 (two) times a day.  Dispense: 180 tablet; Refill: 1  4. Abnormal glucose Diet controlled. Will check labs as below and f/u pending results. - CBC w/Diff/Platelet - Comprehensive Metabolic Panel (CMET) -  Lipid Profile - HgB A1c  5. Hypercholesteremia Stable. Diagnosis pulled for medication refill. Continue current medical treatment plan. Will check labs as below and f/u pending results. - CBC w/Diff/Platelet - Comprehensive Metabolic Panel (CMET) - Lipid Profile - HgB A1c - simvastatin (ZOCOR) 20 MG tablet; Take 1 tablet (20 mg total) by mouth every evening.  Dispense: 90 tablet; Refill: 3  6. History of tobacco use Quit smoking in 2009.   --------------------------------------------------------------------    [name], PA-C  [facility]

## 2018-12-09 NOTE — Patient Instructions (Signed)

## 2018-12-15 ENCOUNTER — Telehealth: Payer: Self-pay

## 2018-12-15 LAB — COMPREHENSIVE METABOLIC PANEL
ALT: 25 IU/L (ref 0–32)
AST: 21 IU/L (ref 0–40)
Albumin/Globulin Ratio: 2 (ref 1.2–2.2)
Albumin: 4.7 g/dL (ref 3.8–4.8)
Alkaline Phosphatase: 61 IU/L (ref 39–117)
BUN/Creatinine Ratio: 19 (ref 12–28)
BUN: 11 mg/dL (ref 8–27)
Bilirubin Total: 0.3 mg/dL (ref 0.0–1.2)
CO2: 24 mmol/L (ref 20–29)
Calcium: 9.7 mg/dL (ref 8.7–10.3)
Chloride: 100 mmol/L (ref 96–106)
Creatinine, Ser: 0.59 mg/dL (ref 0.57–1.00)
GFR calc Af Amer: 114 mL/min/{1.73_m2} (ref >59)
GFR calc non Af Amer: 99 mL/min/{1.73_m2} (ref >59)
Globulin, Total: 2.4 g/dL (ref 1.5–4.5)
Glucose: 108 mg/dL — ABNORMAL HIGH (ref 65–99)
Potassium: 4.6 mmol/L (ref 3.5–5.2)
Sodium: 141 mmol/L (ref 134–144)
Total Protein: 7.1 g/dL (ref 6.0–8.5)

## 2018-12-15 LAB — CBC WITH DIFFERENTIAL/PLATELET
Basophils Absolute: 0 10*3/uL (ref 0.0–0.2)
Basos: 0 %
EOS (ABSOLUTE): 0.1 10*3/uL (ref 0.0–0.4)
Eos: 1 %
Hematocrit: 41.1 % (ref 34.0–46.6)
Hemoglobin: 13.9 g/dL (ref 11.1–15.9)
Immature Grans (Abs): 0 10*3/uL (ref 0.0–0.1)
Immature Granulocytes: 0 %
Lymphocytes Absolute: 1.7 10*3/uL (ref 0.7–3.1)
Lymphs: 27 %
MCH: 31.9 pg (ref 26.6–33.0)
MCHC: 33.8 g/dL (ref 31.5–35.7)
MCV: 94 fL (ref 79–97)
Monocytes Absolute: 0.7 10*3/uL (ref 0.1–0.9)
Monocytes: 10 %
Neutrophils Absolute: 4 10*3/uL (ref 1.4–7.0)
Neutrophils: 62 %
Platelets: 306 10*3/uL (ref 150–450)
RBC: 4.36 x10E6/uL (ref 3.77–5.28)
RDW: 12.2 % (ref 11.7–15.4)
WBC: 6.5 10*3/uL (ref 3.4–10.8)

## 2018-12-15 LAB — LIPID PANEL
Chol/HDL Ratio: 3.4 ratio (ref 0.0–4.4)
Cholesterol, Total: 222 mg/dL — ABNORMAL HIGH (ref 100–199)
HDL: 65 mg/dL (ref >39)
LDL Calculated: 116 mg/dL — ABNORMAL HIGH (ref 0–99)
Triglycerides: 206 mg/dL — ABNORMAL HIGH (ref 0–149)
VLDL Cholesterol Cal: 41 mg/dL — ABNORMAL HIGH (ref 5–40)

## 2018-12-15 LAB — TSH: TSH: 1.37 u[IU]/mL (ref 0.450–4.500)

## 2018-12-15 LAB — HEMOGLOBIN A1C
Est. average glucose Bld gHb Est-mCnc: 117 mg/dL
Hgb A1c MFr Bld: 5.7 % — ABNORMAL HIGH (ref 4.8–5.6)

## 2018-12-15 NOTE — Telephone Encounter (Signed)
-----   Message from [name], [place] sent at 12/15/2018  3:35 PM EDT ----- Blood count is normal. Kidney and liver function is normal. Thyroid is normal. Cholesterol is fairly stable. A1c is stable at 5.7.

## 2018-12-15 NOTE — Telephone Encounter (Signed)
Patient advised as directed below. 

## 2019-02-08 ENCOUNTER — Ambulatory Visit: Payer: Managed Care, Other (non HMO)

## 2019-03-15 ENCOUNTER — Ambulatory Visit
Admission: RE | Admit: 2019-03-15 | Discharge: 2019-03-15 | Disposition: A | Discharge status: Home / Self Care | Payer: Managed Care, Other (non HMO) | Source: Ambulatory Visit | Attending: Physician Assistant | Admitting: Physician Assistant

## 2019-03-15 DIAGNOSIS — Z1231 Encounter for screening mammogram for malignant neoplasm of breast: Secondary | ICD-10-CM | POA: Diagnosis not present

## 2019-03-15 DIAGNOSIS — Z1239 Encounter for other screening for malignant neoplasm of breast: Secondary | ICD-10-CM

## 2019-03-16 ENCOUNTER — Telehealth: Payer: Self-pay

## 2019-03-16 NOTE — Telephone Encounter (Signed)
Patient advised as directed below. 

## 2019-03-16 NOTE — Telephone Encounter (Signed)
-----   Message from [name], PA-C sent at 03/16/2019  9:29 AM EDT ----- Normal mammogram. Repeat screening in one year.

## 2019-12-12 ENCOUNTER — Encounter: Payer: Self-pay | Admitting: Physician Assistant

## 2019-12-12 ENCOUNTER — Ambulatory Visit (INDEPENDENT_AMBULATORY_CARE_PROVIDER_SITE_OTHER): Payer: Managed Care, Other (non HMO) | Admitting: Physician Assistant

## 2019-12-12 ENCOUNTER — Other Ambulatory Visit: Payer: Self-pay

## 2019-12-12 ENCOUNTER — Other Ambulatory Visit (HOSPITAL_COMMUNITY)
Admission: RE | Admit: 2019-12-12 | Discharge: 2019-12-12 | Disposition: A | Discharge status: Home / Self Care | Payer: Managed Care, Other (non HMO) | Source: Ambulatory Visit | Attending: Physician Assistant | Admitting: Physician Assistant

## 2019-12-12 VITALS — BP 111/73 | HR 66 | Temp 97.3°F | Resp 16 | Ht 62.0 in | Wt 156.0 lb

## 2019-12-12 DIAGNOSIS — Z126 Encounter for screening for malignant neoplasm of bladder: Secondary | ICD-10-CM

## 2019-12-12 DIAGNOSIS — Z1231 Encounter for screening mammogram for malignant neoplasm of breast: Secondary | ICD-10-CM | POA: Diagnosis not present

## 2019-12-12 DIAGNOSIS — G5601 Carpal tunnel syndrome, right upper limb: Secondary | ICD-10-CM

## 2019-12-12 DIAGNOSIS — Z124 Encounter for screening for malignant neoplasm of cervix: Secondary | ICD-10-CM | POA: Insufficient documentation

## 2019-12-12 DIAGNOSIS — I10 Essential (primary) hypertension: Secondary | ICD-10-CM

## 2019-12-12 DIAGNOSIS — Z0000 Encounter for general adult medical examination without abnormal findings: Secondary | ICD-10-CM | POA: Diagnosis not present

## 2019-12-12 DIAGNOSIS — R7309 Other abnormal glucose: Secondary | ICD-10-CM

## 2019-12-12 DIAGNOSIS — R3121 Asymptomatic microscopic hematuria: Secondary | ICD-10-CM | POA: Diagnosis not present

## 2019-12-12 DIAGNOSIS — E7800 Pure hypercholesterolemia, unspecified: Secondary | ICD-10-CM

## 2019-12-12 LAB — POCT URINALYSIS DIPSTICK
Bilirubin, UA: NEGATIVE
Glucose, UA: NEGATIVE
Ketones, UA: NEGATIVE
Leukocytes, UA: NEGATIVE
Nitrite, UA: NEGATIVE
Protein, UA: NEGATIVE
Spec Grav, UA: 1.02 (ref 1.010–1.025)
Urobilinogen, UA: 0.2 E.U./dL
pH, UA: 6 (ref 5.0–8.0)

## 2019-12-12 MED ORDER — LISINOPRIL-HYDROCHLOROTHIAZIDE 20-25 MG PO TABS: 1.0000 | ORAL_TABLET | Freq: Every day | ORAL | 3 refills | Status: DC

## 2019-12-12 NOTE — Assessment & Plan Note (Signed)
Pap collected today. Will send as below and f/u pending results.

## 2019-12-12 NOTE — Progress Notes (Signed)
I,Kristen Wall,acting as a scribe for [employer], PA-C.,have documented all relevant documentation on the behalf of Kristen Daring, PA-C,as directed by  Kristen Daring, PA-C while in the presence of Kristen Wall, [place]. Complete physical exam   Patient: Kristen Wall   DOB: [birth year]   64 y.o. Female  MRN: [id number] Visit Date: 12/12/2019  Today's healthcare provider: [name], PA-C   Chief Complaint  Patient presents with  . Annual Exam   Subjective    Kristen Wall is a 64 y.o. female who presents today for a complete physical exam.  She reports consuming a general diet. The patient does not participate in regular exercise at present. She generally feels well. She reports sleeping well. She does have additional problems to discuss today.  HPI  06/16/18-Cologuard negative. Can repeat in 3 years. 03/16/19-Normal mammogram. Repeat screening in one year 12/05/2016-Pap is normal, HPV negative. Will repeat in 3-5 years.   She c/o numbness on the 1st - 3rd fingers, right hand. Reports that this has been going on for about a year. The numbness is present all the time. No pain or swelling. Does work on a Teaching laboratory technician and types. Has not tried anything for the pain.   No past medical history on file. Past Surgical History:  Procedure Laterality Date  . TUBAL LIGATION  1995   Social History   Socioeconomic History  . Marital status: Married    Spouse name: Kristen Wall  . Number of children: 2  . Years of education: College  . Highest education level: Not on file  Occupational History  . Occupation: Kristen Wall    Comment: Full-Time  Tobacco Use  . Smoking status: Former Smoker    Quit date: 06/03/2007    Years since quitting: 12.5  . Smokeless tobacco: Never Used  Vaping Use  . Vaping Use: Never used  Substance and Sexual Activity  . Alcohol use: Yes    Comment: Occasional, Drinks beer for social events.  . Drug use: No  . Sexual activity:  Not on file  Other Topics Concern  . Not on file  Social History Narrative  . Not on file   Social Determinants of Health   Financial Resource Strain:   . Difficulty of Paying Living Expenses:   Food Insecurity:   . Worried About Charity fundraiser in the Last Year:   . Arboriculturist in the Last Year:   Transportation Needs:   . Film/video editor (Medical):   Kristen Wall Kitchen Lack of Transportation (Non-Medical):   Physical Activity:   . Days of Exercise per Week:   . Minutes of Exercise per Session:   Stress:   . Feeling of Stress :   Social Connections:   . Frequency of Communication with Friends and Family:   . Frequency of Social Gatherings with Friends and Family:   . Attends Religious Services:   . Active Member of Clubs or Organizations:   . Attends Archivist Meetings:   Kristen Wall Kitchen Marital Status:   Intimate Partner Violence:   . Fear of Current or Ex-Partner:   . Emotionally Abused:   Kristen Wall Kitchen Physically Abused:   . Sexually Abused:    Family Status  Relation Name Status  . Mother  Deceased at age 79  . Father  Deceased at age 19  . Brother  Deceased at age 52  . MGM  Deceased  . Cousin  Alive  . Other neice Alive  Family History  Problem Relation Age of Onset  . Arthritis Mother   . Diabetes Mother   . Hypertension Mother   . Coronary artery disease Mother   . Esophageal cancer Father   . Heart attack Brother        Age of death: 64  . Heart attack Maternal Grandmother   . Breast cancer Cousin        pat cousin  . Breast cancer Other 31       2 nieces   No Known Allergies  Patient Care Team: Rubye Beach as PCP - General (Family Medicine)   Medications: Outpatient Medications Prior to Visit  Medication Sig  . bimatoprost (LUMIGAN) 0.01 % SOLN Apply to eye.  . Cholecalciferol (VITAMIN D) 2000 UNITS tablet Take by mouth.  . latanoprost (XALATAN) 0.005 % ophthalmic solution INSTILL 1 DROP INTO EACH EYE AT BEDTIME  .  lisinopril-hydrochlorothiazide (ZESTORETIC) 10-12.5 MG tablet Take 1 tablet by mouth 2 (two) times a day.  . MULTIPLE VITAMINS PO Take by mouth.  . OMEGA 3-6-9 FATTY ACIDS PO Take by mouth.  . simvastatin (ZOCOR) 20 MG tablet Take 1 tablet (20 mg total) by mouth every evening.   No facility-administered medications prior to visit.    Review of Systems  Constitutional: Negative.   HENT: Negative.   Eyes: Negative.   Respiratory: Negative.   Cardiovascular: Negative.   Gastrointestinal: Negative.   Endocrine: Negative.   Genitourinary: Negative.   Musculoskeletal: Negative.   Skin: Negative.   Allergic/Immunologic: Positive for environmental allergies.  Neurological: Positive for numbness.  Hematological: Negative.   Psychiatric/Behavioral: Negative.       Objective    BP 111/73 (BP Location: Left Arm, Patient Position: Sitting, Cuff Size: Normal)   Pulse 66   Temp (!) 97.3 F (36.3 C) (Temporal)   Resp 16   Ht 5\' 2"  (1.575 m)   Wt 156 lb (70.8 kg)   BMI 28.53 kg/m    Physical Exam Vitals reviewed. Exam conducted with a chaperone present.  Constitutional:      General: She is not in acute distress.    Appearance: Normal appearance. She is well-developed, well-groomed and overweight. She is not ill-appearing or diaphoretic.  HENT:     Head: Normocephalic and atraumatic.     Right Ear: Hearing, tympanic membrane, ear canal and external ear normal.     Left Ear: Hearing, tympanic membrane, ear canal and external ear normal.     Mouth/Throat:     Pharynx: Uvula midline.  Eyes:     General: No scleral icterus.       Right eye: No discharge.        Left eye: No discharge.     Extraocular Movements: Extraocular movements intact.     Conjunctiva/sclera: Conjunctivae normal.     Pupils: Pupils are equal, round, and reactive to light.  Neck:     Thyroid: No thyroid mass, thyromegaly or thyroid tenderness.     Vascular: No carotid bruit or JVD.     Trachea: No tracheal  deviation.  Cardiovascular:     Rate and Rhythm: Normal rate and regular rhythm.     Pulses: Normal pulses.     Heart sounds: Normal heart sounds. No murmur heard.  No friction rub. No gallop.   Pulmonary:     Effort: Pulmonary effort is normal. No respiratory distress.     Breath sounds: Normal breath sounds. No wheezing, rhonchi or rales.  Chest:  Chest wall: No mass or tenderness.     Breasts: Breasts are symmetrical.        Right: Normal. No inverted nipple, mass, nipple discharge, skin change or tenderness.        Left: Normal. No inverted nipple, mass, nipple discharge, skin change or tenderness.  Abdominal:     General: Abdomen is flat. Bowel sounds are normal. There is no distension.     Palpations: Abdomen is soft. There is no mass.     Tenderness: There is no abdominal tenderness. There is no guarding or rebound.     Hernia: There is no hernia in the left inguinal area.  Genitourinary:    General: Normal vulva.     Exam position: Supine.     Labia:        Right: No rash, tenderness, lesion or injury.        Left: No rash, tenderness, lesion or injury.      Vagina: Normal. No signs of injury. No vaginal discharge, erythema, tenderness or bleeding.     Cervix: No cervical motion tenderness, discharge or friability.     Adnexa:        Right: No mass, tenderness or fullness.         Left: No mass, tenderness or fullness.       Rectum: Normal.  Musculoskeletal:        General: No tenderness. Normal range of motion.     Right wrist: No swelling, deformity, tenderness, bony tenderness, snuff box tenderness or crepitus. Normal range of motion. Normal pulse.     Cervical back: Normal range of motion and neck supple.     Right lower leg: No edema.     Left lower leg: No edema.     Comments: Negative tinel sign; positive phalen test  Lymphadenopathy:     Cervical: No cervical adenopathy.     Upper Body:     Right upper body: No supraclavicular, axillary or pectoral  adenopathy.     Left upper body: No supraclavicular, axillary or pectoral adenopathy.  Skin:    General: Skin is warm and dry.     Capillary Refill: Capillary refill takes less than 2 seconds.     Findings: No rash.  Neurological:     General: No focal deficit present.     Mental Status: She is alert and oriented to person, place, and time. Mental status is at baseline.     Cranial Nerves: No cranial nerve deficit.     Coordination: Coordination normal.     Deep Tendon Reflexes: Reflexes are normal and symmetric.  Psychiatric:        Mood and Affect: Mood normal.        Behavior: Behavior normal. Behavior is cooperative.        Thought Content: Thought content normal.        Judgment: Judgment normal.      Last depression screening scores PHQ 2/9 Scores 12/12/2019 12/09/2018 12/02/2017  PHQ - 2 Score 0 0 0  PHQ- 9 Score - 0 1   Last fall risk screening Fall Risk  12/12/2019  Falls in the past year? 0  Number falls in past yr: 0  Injury with Fall? 0  Risk for fall due to : No Fall Risks  Follow up Falls evaluation completed   Last Audit-C alcohol use screening Alcohol Use Disorder Test (AUDIT) 12/12/2019  1. How often do you have a drink containing alcohol? 3  2. How many drinks  containing alcohol do you have on a typical day when you are drinking? 0  3. How often do you have six or more drinks on one occasion? 0  AUDIT-C Score 3  Alcohol Brief Interventions/Follow-up AUDIT Score <7 follow-up not indicated   A score of 3 or more in women, and 4 or more in men indicates increased risk for alcohol abuse, EXCEPT if all of the points are from question 1   No results found for any visits on 12/12/19.  Assessment & Plan    Routine Health Maintenance and Physical Exam  Exercise Activities and Dietary recommendations Goals    . Exercise 150 minutes per week (moderate activity)    . Reduce portion size     Decrease carbohydrates.        Immunization History  Administered  Date(s) Administered  . Influenza, High Dose Seasonal PF 03/03/2017  . Td 06/23/1995, 12/02/2017    Health Maintenance  Topic Date Due  . COVID-19 Vaccine (1) Never done  . PAP SMEAR-Modifier  12/03/2019  . INFLUENZA VACCINE  01/01/2020  . MAMMOGRAM  03/14/2021  . Fecal DNA (Cologuard)  06/09/2021  . TETANUS/TDAP  12/03/2027  . Hepatitis C Screening  Completed  . HIV Screening  Completed    Discussed health benefits of physical activity, and encouraged her to engage in regular exercise appropriate for her age and condition.  Problem List Items Addressed This Visit      Cardiovascular and Mediastinum   BP (high blood pressure)    Improved with increasing lisinopril-hctz to two tabs via a mychart message before being seen today. Dose of medication will be increased to lisinopril-HCTZ 20-25mg  as her BP is well controlled on this dose currently.  Will check labs as below and f/u pending results. F/U in 6-12 months.      Relevant Medications   lisinopril-hydrochlorothiazide (ZESTORETIC) 20-25 MG tablet   Other Relevant Orders   MM 3D SCREEN BREAST BILATERAL   CBC with Differential/Platelet   Hemoglobin A1c   TSH     Nervous and Auditory   Carpal tunnel syndrome on right    Discuss treatment options with patient like wearing a cock-up wrist splint while typing and using NSAIDs like aleve or ibuprofen. Referral placed to Orthopedics with Dr.Soria       Relevant Orders   Ambulatory referral to Orthopedic Surgery     Other   Hypercholesteremia    Stable on Simvastatin 20mg . Will check labs as below and f/u pending results.      Relevant Medications   lisinopril-hydrochlorothiazide (ZESTORETIC) 20-25 MG tablet   Other Relevant Orders   CBC with Differential/Platelet   Comprehensive metabolic panel   Lipid panel   Hematuria    Noted on UA today. Patient with personal smoking history and family history of bladder cancer. Will f/u pending results of the urine  microanalysis.      Encounter for screening mammogram for malignant neoplasm of breast - Primary    Breast exam today was normal. There is no family history of breast cancer. She does perform regular self breast exams. Mammogram was ordered as below. Information for [facility] Breast clinic was given to patient so she may schedule her mammogram at her convenience.      Relevant Orders   MM 3D SCREEN BREAST BILATERAL   Abnormal glucose    Will check labs as below and f/u pending results.      Relevant Orders   Hemoglobin A1c   Encounter for screening  for cervical cancer     Pap collected today. Will send as below and f/u pending results.      Relevant Orders   Cytology - PAP      No follow-ups on file.        Rubye Beach  [facility] [phone] (phone) [phone] (fax)  [place]

## 2019-12-12 NOTE — Assessment & Plan Note (Signed)
Breast exam today was normal. There is no family history of breast cancer. She does perform regular self breast exams. Mammogram was ordered as below. Information for [facility] Breast clinic was given to patient so she may schedule her mammogram at her convenience.

## 2019-12-12 NOTE — Patient Instructions (Addendum)
Health Maintenance, Female Adopting a healthy lifestyle and getting preventive care are important in promoting health and wellness. Ask your health care provider about:  The right schedule for you to have regular tests and exams.  Things you can do on your own to prevent diseases and keep yourself healthy. What should I know about diet, weight, and exercise? Eat a healthy diet   Eat a diet that includes plenty of vegetables, fruits, low-fat dairy products, and lean protein.  Do not eat a lot of foods that are high in solid fats, added sugars, or sodium. Maintain a healthy weight Body mass index (BMI) is used to identify weight problems. It estimates body fat based on height and weight. Your health care provider can help determine your BMI and help you achieve or maintain a healthy weight. Get regular exercise Get regular exercise. This is one of the most important things you can do for your health. Most adults should:  Exercise for at least 150 minutes each week. The exercise should increase your heart rate and make you sweat (moderate-intensity exercise).  Do strengthening exercises at least twice a week. This is in addition to the moderate-intensity exercise.  Spend less time sitting. Even light physical activity can be beneficial. Watch cholesterol and blood lipids Have your blood tested for lipids and cholesterol at 63 years of age, then have this test every 5 years. Have your cholesterol levels checked more often if:  Your lipid or cholesterol levels are high.  You are older than 63 years of age.  You are at high risk for heart disease. What should I know about cancer screening? Depending on your health history and family history, you may need to have cancer screening at various ages. This may include screening for:  Breast cancer.  Cervical cancer.  Colorectal cancer.  Skin cancer.  Lung cancer. What should I know about heart disease, diabetes, and high blood  pressure? Blood pressure and heart disease  High blood pressure causes heart disease and increases the risk of stroke. This is more likely to develop in people who have high blood pressure readings, are of African descent, or are overweight.  Have your blood pressure checked: ? Every 3-5 years if you are 54-9 years of age. ? Every year if you are 69 years old or older. Diabetes Have regular diabetes screenings. This checks your fasting blood sugar level. Have the screening done:  Once every three years after age 36 if you are at a normal weight and have a low risk for diabetes.  More often and at a younger age if you are overweight or have a high risk for diabetes. What should I know about preventing infection? Hepatitis B If you have a higher risk for hepatitis B, you should be screened for this virus. Talk with your health care provider to find out if you are at risk for hepatitis B infection. Hepatitis C Testing is recommended for:  Everyone born from [birth year] through 1964.  Anyone with known risk factors for hepatitis C. Sexually transmitted infections (STIs)  Get screened for STIs, including gonorrhea and chlamydia, if: ? You are sexually active and are younger than 63 years of age. ? You are older than 63 years of age and your health care provider tells you that you are at risk for this type of infection. ? Your sexual activity has changed since you were last screened, and you are at increased risk for chlamydia or gonorrhea. Ask your health care provider  if you are at risk.  Ask your health care provider about whether you are at high risk for HIV. Your health care provider may recommend a prescription medicine to help prevent HIV infection. If you choose to take medicine to prevent HIV, you should first get tested for HIV. You should then be tested every 3 months for as long as you are taking the medicine. Pregnancy  If you are about to stop having your period (premenopausal) and  you may become pregnant, seek counseling before you get pregnant.  Take 400 to 800 micrograms (mcg) of folic acid every day if you become pregnant.  Ask for birth control (contraception) if you want to prevent pregnancy. Osteoporosis and menopause Osteoporosis is a disease in which the bones lose minerals and strength with aging. This can result in bone fractures. If you are 89 years old or older, or if you are at risk for osteoporosis and fractures, ask your health care provider if you should:  Be screened for bone loss.  Take a calcium or vitamin D supplement to lower your risk of fractures.  Be given hormone replacement therapy (HRT) to treat symptoms of menopause. Follow these instructions at home: Lifestyle  Do not use any products that contain nicotine or tobacco, such as cigarettes, e-cigarettes, and chewing tobacco. If you need help quitting, ask your health care provider.  Do not use street drugs.  Do not share needles.  Ask your health care provider for help if you need support or information about quitting drugs. Alcohol use  Do not drink alcohol if: ? Your health care provider tells you not to drink. ? You are pregnant, may be pregnant, or are planning to become pregnant.  If you drink alcohol: ? Limit how much you use to 0-1 drink a day. ? Limit intake if you are breastfeeding.  Be aware of how much alcohol is in your drink. In the [place] one drink equals one 12 oz bottle of beer (355 mL), one 5 oz glass of wine (148 mL), or one 1 oz glass of hard liquor (44 mL). General instructions  Schedule regular health, dental, and eye exams.  Stay current with your vaccines.  Tell your health care provider if: ? You often feel depressed. ? You have ever been abused or do not feel safe at home. Summary  Adopting a healthy lifestyle and getting preventive care are important in promoting health and wellness.  Follow your health care provider's instructions about healthy  diet, exercising, and getting tested or screened for diseases.  Follow your health care provider's instructions on monitoring your cholesterol and blood pressure. This information is not intended to replace advice given to you by your health care provider. Make sure you discuss any questions you have with your health care provider. Document Revised: 05/12/2018 Document Reviewed: 05/12/2018 Elsevier Patient Education  [place] Syndrome  Carpal tunnel syndrome is a condition that causes pain in your hand and arm. The carpal tunnel is a narrow area that is on the palm side of your wrist. Repeated wrist motion or certain diseases may cause swelling in the tunnel. This swelling can pinch the main nerve in the wrist (median nerve). What are the causes? This condition may be caused by:  Repeated wrist motions.  Wrist injuries.  Arthritis.  A sac of fluid (cyst) or abnormal growth (tumor) in the carpal tunnel.  Fluid buildup during pregnancy. Sometimes the cause is not known. What increases the risk? The  following factors may make you more likely to develop this condition:  Having a job in which you move your wrist in the same way many times. This includes jobs like being a Software engineer or a Scientist, water quality.  Being a woman.  Having other health conditions, such as: ? Diabetes. ? Obesity. ? A thyroid gland that is not active enough (hypothyroidism). ? Kidney failure. What are the signs or symptoms? Symptoms of this condition include:  A tingling feeling in your fingers.  Tingling or a loss of feeling (numbness) in your hand.  Pain in your entire arm. This pain may get worse when you bend your wrist and elbow for a long time.  Pain in your wrist that goes up your arm to your shoulder.  Pain that goes down into your palm or fingers.  A weak feeling in your hands. You may find it hard to grab and hold items. You may feel worse at night. How is this diagnosed? This  condition is diagnosed with a medical history and physical exam. You may also have tests, such as:  Electromyogram (EMG). This test checks the signals that the nerves send to the muscles.  Nerve conduction study. This test checks how well signals pass through your nerves.  Imaging tests, such as X-rays, ultrasound, and MRI. These tests check for what might be the cause of your condition. How is this treated? This condition may be treated with:  Lifestyle changes. You will be asked to stop or change the activity that caused your problem.  Doing exercise and activities that make bones and muscles stronger (physical therapy).  Learning how to use your hand again (occupational therapy).  Medicines for pain and swelling (inflammation). You may have injections in your wrist.  A wrist splint.  Surgery. Follow these instructions at home: If you have a splint:  Wear the splint as told by your doctor. Remove it only as told by your doctor.  Loosen the splint if your fingers: ? Tingle. ? Lose feeling (become numb). ? Turn cold and blue.  Keep the splint clean.  If the splint is not waterproof: ? Do not let it get wet. ? Cover it with a watertight covering when you take a bath or a shower. Managing pain, stiffness, and swelling   If told, put ice on the painful area: ? If you have a removable splint, remove it as told by your doctor. ? Put ice in a plastic bag. ? Place a towel between your skin and the bag. ? Leave the ice on for 20 minutes, 2-3 times per day. General instructions  Take over-the-counter and prescription medicines only as told by your doctor.  Rest your wrist from any activity that may cause pain. If needed, talk with your boss at work about changes that can help your wrist heal.  Do any exercises as told by your doctor, physical therapist, or occupational therapist.  Keep all follow-up visits as told by your doctor. This is important. Contact a doctor  if:  You have new symptoms.  Medicine does not help your pain.  Your symptoms get worse. Get help right away if:  You have very bad numbness or tingling in your wrist or hand. Summary  Carpal tunnel syndrome is a condition that causes pain in your hand and arm.  It is often caused by repeated wrist motions.  Lifestyle changes and medicines are used to treat this problem. Surgery may help in very bad cases.  Follow your doctor's instructions about  wearing a splint, resting your wrist, keeping follow-up visits, and calling for help. This information is not intended to replace advice given to you by your health care provider. Make sure you discuss any questions you have with your health care provider. Document Revised: 09/25/2017 Document Reviewed: 09/25/2017 Elsevier Patient Education  [place].

## 2019-12-12 NOTE — Assessment & Plan Note (Addendum)
Discuss treatment options with patient like wearing a cock-up wrist splint while typing and using NSAIDs like aleve or ibuprofen. Referral placed to Orthopedics with Dr.Soria

## 2019-12-12 NOTE — Assessment & Plan Note (Signed)
Will check labs as below and f/u pending results.

## 2019-12-12 NOTE — Assessment & Plan Note (Signed)
Stable on Simvastatin 20mg . Will check labs as below and f/u pending results.

## 2019-12-12 NOTE — Assessment & Plan Note (Addendum)
Improved with increasing lisinopril-hctz to two tabs via a mychart message before being seen today. Dose of medication will be increased to lisinopril-HCTZ 20-25mg  as her BP is well controlled on this dose currently.  Will check labs as below and f/u pending results. F/U in 6-12 months.

## 2019-12-12 NOTE — Assessment & Plan Note (Signed)
Noted on UA today. Patient with personal smoking history and family history of bladder cancer. Will f/u pending results of the urine microanalysis.

## 2019-12-13 LAB — CBC WITH DIFFERENTIAL/PLATELET
Basophils Absolute: 0 10*3/uL (ref 0.0–0.2)
Basos: 0 %
EOS (ABSOLUTE): 0.1 10*3/uL (ref 0.0–0.4)
Eos: 1 %
Hematocrit: 39.6 % (ref 34.0–46.6)
Hemoglobin: 13.6 g/dL (ref 11.1–15.9)
Immature Grans (Abs): 0 10*3/uL (ref 0.0–0.1)
Immature Granulocytes: 0 %
Lymphocytes Absolute: 1.7 10*3/uL (ref 0.7–3.1)
Lymphs: 22 %
MCH: 32.5 pg (ref 26.6–33.0)
MCHC: 34.3 g/dL (ref 31.5–35.7)
MCV: 95 fL (ref 79–97)
Monocytes Absolute: 0.7 10*3/uL (ref 0.1–0.9)
Monocytes: 9 %
Neutrophils Absolute: 5.3 10*3/uL (ref 1.4–7.0)
Neutrophils: 68 %
Platelets: 198 10*3/uL (ref 150–450)
RBC: 4.18 x10E6/uL (ref 3.77–5.28)
RDW: 12.6 % (ref 11.7–15.4)
WBC: 7.8 10*3/uL (ref 3.4–10.8)

## 2019-12-13 LAB — COMPREHENSIVE METABOLIC PANEL
ALT: 28 IU/L (ref 0–32)
AST: 19 IU/L (ref 0–40)
Albumin/Globulin Ratio: 1.6 (ref 1.2–2.2)
Albumin: 4.5 g/dL (ref 3.8–4.8)
Alkaline Phosphatase: 76 IU/L (ref 48–121)
BUN/Creatinine Ratio: 23 (ref 12–28)
BUN: 13 mg/dL (ref 8–27)
Bilirubin Total: 0.3 mg/dL (ref 0.0–1.2)
CO2: 25 mmol/L (ref 20–29)
Calcium: 9.4 mg/dL (ref 8.7–10.3)
Chloride: 100 mmol/L (ref 96–106)
Creatinine, Ser: 0.57 mg/dL (ref 0.57–1.00)
GFR calc Af Amer: 114 mL/min/{1.73_m2} (ref >59)
GFR calc non Af Amer: 99 mL/min/{1.73_m2} (ref >59)
Globulin, Total: 2.8 g/dL (ref 1.5–4.5)
Glucose: 143 mg/dL — ABNORMAL HIGH (ref 65–99)
Potassium: 3.8 mmol/L (ref 3.5–5.2)
Sodium: 139 mmol/L (ref 134–144)
Total Protein: 7.3 g/dL (ref 6.0–8.5)

## 2019-12-13 LAB — HEMOGLOBIN A1C
Est. average glucose Bld gHb Est-mCnc: 117 mg/dL
Hgb A1c MFr Bld: 5.7 % — ABNORMAL HIGH (ref 4.8–5.6)

## 2019-12-13 LAB — URINALYSIS, MICROSCOPIC ONLY
Bacteria, UA: NONE SEEN
Casts: NONE SEEN /lpf
Epithelial Cells (non renal): NONE SEEN /hpf (ref 0–10)
WBC, UA: NONE SEEN /hpf (ref 0–5)

## 2019-12-13 LAB — TSH: TSH: 0.753 u[IU]/mL (ref 0.450–4.500)

## 2019-12-13 LAB — LIPID PANEL
Chol/HDL Ratio: 3.7 ratio (ref 0.0–4.4)
Cholesterol, Total: 208 mg/dL — ABNORMAL HIGH (ref 100–199)
HDL: 56 mg/dL (ref >39)
LDL Chol Calc (NIH): 77 mg/dL (ref 0–99)
Triglycerides: 478 mg/dL — ABNORMAL HIGH (ref 0–149)
VLDL Cholesterol Cal: 75 mg/dL — ABNORMAL HIGH (ref 5–40)

## 2019-12-15 ENCOUNTER — Telehealth: Payer: Self-pay

## 2019-12-15 NOTE — Telephone Encounter (Signed)
-----   Message from [name], [place] sent at 12/14/2019  1:58 PM EDT ----- Blood count is normal. Kidney and liver function are normal. Sodium, potassium, and calcium are normal. A1c is elevated at 5.7, stable compared to last year. Limit carbs and sugars. Thyroid is normal. Cholesterol is improved compared to last year but Triglycerides are elevated. This can be seen with non fasting labs. Urine is normal. No blood seen on microanalysis.

## 2019-12-15 NOTE — Telephone Encounter (Signed)
Patient advised as directed below. 

## 2019-12-16 LAB — CYTOLOGY - PAP
Comment: NEGATIVE
Diagnosis: NEGATIVE
High risk HPV: NEGATIVE

## 2020-01-19 ENCOUNTER — Other Ambulatory Visit: Payer: Self-pay | Admitting: Physician Assistant

## 2020-01-19 DIAGNOSIS — E7800 Pure hypercholesterolemia, unspecified: Secondary | ICD-10-CM

## 2020-03-23 ENCOUNTER — Ambulatory Visit
Admission: RE | Admit: 2020-03-23 | Discharge: 2020-03-23 | Disposition: A | Discharge status: Home / Self Care | Payer: Managed Care, Other (non HMO) | Source: Ambulatory Visit | Attending: Physician Assistant | Admitting: Physician Assistant

## 2020-03-23 ENCOUNTER — Encounter (INDEPENDENT_AMBULATORY_CARE_PROVIDER_SITE_OTHER): Payer: Self-pay

## 2020-03-23 ENCOUNTER — Other Ambulatory Visit: Payer: Self-pay

## 2020-03-23 DIAGNOSIS — I10 Essential (primary) hypertension: Secondary | ICD-10-CM | POA: Diagnosis present

## 2020-03-23 DIAGNOSIS — Z1231 Encounter for screening mammogram for malignant neoplasm of breast: Secondary | ICD-10-CM | POA: Insufficient documentation

## 2020-03-27 NOTE — Progress Notes (Signed)
Within normal limit mammogram- radiology advises repeat in 1 year and sooner if clinically indicated.   RECOMMENDATION: Screening mammogram in one year. (Code:[id number])  BI-RADS CATEGORY  1: Negative.   Electronically Signed   By: Lajean Manes M.D.   On: 03/26/2020 13:57

## 2020-07-30 ENCOUNTER — Other Ambulatory Visit: Payer: Self-pay | Admitting: Physician Assistant

## 2020-07-30 DIAGNOSIS — E7800 Pure hypercholesterolemia, unspecified: Secondary | ICD-10-CM

## 2020-12-13 ENCOUNTER — Encounter: Payer: Self-pay | Admitting: Family Medicine

## 2020-12-13 ENCOUNTER — Other Ambulatory Visit: Payer: Self-pay

## 2020-12-13 ENCOUNTER — Encounter: Payer: Self-pay | Admitting: Physician Assistant

## 2020-12-13 ENCOUNTER — Ambulatory Visit (INDEPENDENT_AMBULATORY_CARE_PROVIDER_SITE_OTHER): Payer: Managed Care, Other (non HMO) | Admitting: Family Medicine

## 2020-12-13 VITALS — BP 93/59 | HR 64 | Temp 98.2°F | Resp 16 | Ht 62.0 in | Wt 163.8 lb

## 2020-12-13 DIAGNOSIS — Z1231 Encounter for screening mammogram for malignant neoplasm of breast: Secondary | ICD-10-CM

## 2020-12-13 DIAGNOSIS — Z0000 Encounter for general adult medical examination without abnormal findings: Secondary | ICD-10-CM

## 2020-12-13 DIAGNOSIS — E7800 Pure hypercholesterolemia, unspecified: Secondary | ICD-10-CM | POA: Diagnosis not present

## 2020-12-13 DIAGNOSIS — Z23 Encounter for immunization: Secondary | ICD-10-CM | POA: Diagnosis not present

## 2020-12-13 DIAGNOSIS — I10 Essential (primary) hypertension: Secondary | ICD-10-CM | POA: Diagnosis not present

## 2020-12-13 DIAGNOSIS — R7303 Prediabetes: Secondary | ICD-10-CM

## 2020-12-13 NOTE — Progress Notes (Signed)
Complete physical exam   Patient: Kristen Wall   DOB: December 24, 1955   65 y.o. Female  MRN: [id number] Visit Date: 12/13/2020  Today's healthcare provider: Lavon Paganini, MD   Chief Complaint  Patient presents with   Annual Exam   Subjective    Kristen Wall is a 65 y.o. female who presents today for a complete physical exam.   She reports consuming a general diet. The patient does not participate in regular exercise at present. She generally feels well. She reports sleeping well. She does not have additional problems to discuss today.   HPI   Vaccines  She will be receiving her 1st shingrix vaccine today in office. She has 2 COVID vaccines and is eligible for 2 boosters.  Screenings 12/12/19 Pap/HPV-negative 03/23/20 Mammogram-BI-RADS 1 06/09/18 Cologuard-negative  History reviewed. No pertinent past medical history. Past Surgical History:  Procedure Laterality Date   TUBAL LIGATION  1995   Social History   Socioeconomic History   Marital status: Married    Spouse name: [name]   Number of children: 2   Years of education: College   Highest education level: Not on file  Occupational History   Occupation: [employer]    Comment: Full-Time  Tobacco Use   Smoking status: Former    Types: Cigarettes    Quit date: 06/03/2007    Years since quitting: 13.5   Smokeless tobacco: Never  Vaping Use   Vaping Use: Never used  Substance and Sexual Activity   Alcohol use: Yes    Comment: Occasional, Drinks beer for social events.   Drug use: No   Sexual activity: Not on file  Other Topics Concern   Not on file  Social History Narrative   Not on file   Social Determinants of Health   Financial Resource Strain: Not on file  Food Insecurity: Not on file  Transportation Needs: Not on file  Physical Activity: Not on file  Stress: Not on file  Social Connections: Not on file  Intimate Partner Violence: Not on file   Family Status  Relation Name Status   Mother   Deceased at age 60   Father  Deceased at age 73   Brother  Deceased at age 91   Daughter  45   Son  Martinez  Deceased   [place]   Other neice [place]   Family History  Problem Relation Age of Onset   Arthritis Mother    Diabetes Mother    Hypertension Mother    Coronary artery disease Mother    Esophageal cancer Father    Heart attack Brother        Age of death: 72   Hypertension Daughter    Heart attack Maternal Grandmother    Breast cancer Cousin        3 pat cousins   Breast cancer Other 48       2 nieces   No Known Allergies  Patient Care Team: [place], Clearnce Sorrel, PA-C as PCP - General (Family Medicine)   Medications: Outpatient Medications Prior to Visit  Medication Sig   brimonidine (ALPHAGAN P) 0.1 % SOLN    Cholecalciferol (VITAMIN D) 2000 UNITS tablet Take by mouth.   latanoprost (XALATAN) 0.005 % ophthalmic solution INSTILL 1 DROP INTO EACH EYE AT BEDTIME   lisinopril-hydrochlorothiazide (ZESTORETIC) 20-25 MG tablet Take 1 tablet by mouth daily.   MULTIPLE VITAMINS PO Take by mouth.   OMEGA 3-6-9 FATTY ACIDS PO Take by  mouth.   simvastatin (ZOCOR) 20 MG tablet Take 1 tablet by mouth in the evening   bimatoprost (LUMIGAN) 0.01 % SOLN Apply to eye. (Patient not taking: Reported on 12/13/2020)   No facility-administered medications prior to visit.   Review of Systems  Constitutional:  Negative for chills, fatigue and fever.  HENT:  Negative for ear pain, sinus pressure, sinus pain and sore throat.   Eyes:  Negative for pain and visual disturbance.  Respiratory:  Negative for cough, chest tightness, shortness of breath and wheezing.   Cardiovascular:  Negative for chest pain, palpitations and leg swelling.  Gastrointestinal:  Negative for abdominal pain, blood in stool, constipation, diarrhea, nausea and vomiting.  Genitourinary:  Negative for flank pain, frequency, pelvic pain and urgency.  Musculoskeletal:  Negative for back pain, myalgias and  neck pain.  Neurological:  Negative for dizziness, seizures, syncope, weakness, light-headedness, numbness and headaches.     Objective    BP (!) 93/59 (BP Location: Right Arm, Patient Position: Sitting, Cuff Size: Large)   Pulse 64   Temp 98.2 F (36.8 C) (Oral)   Resp 16   Ht 5\' 2"  (1.575 m)   Wt 163 lb 12.8 oz (74.3 kg)   SpO2 99%   BMI 29.96 kg/m    Physical Exam Vitals reviewed.  Constitutional:      General: She is not in acute distress.    Appearance: Normal appearance. She is well-developed. She is not diaphoretic.  HENT:     Head: Normocephalic and atraumatic.     Jaw: There is normal jaw occlusion.     Right Ear: Tympanic membrane, ear canal and external ear normal.     Left Ear: Tympanic membrane, ear canal and external ear normal.     Nose: Nose normal.     Mouth/Throat:     Mouth: Mucous membranes are moist.     Pharynx: Oropharynx is clear. No oropharyngeal exudate.  Eyes:     General: No scleral icterus.    Conjunctiva/sclera: Conjunctivae normal.     Pupils: Pupils are equal, round, and reactive to light.  Neck:     Thyroid: No thyromegaly.  Cardiovascular:     Rate and Rhythm: Normal rate and regular rhythm.     Pulses: Normal pulses.     Heart sounds: Normal heart sounds. No murmur heard. Pulmonary:     Effort: Pulmonary effort is normal. No respiratory distress.     Breath sounds: Normal breath sounds. No wheezing or rales.  Chest:  Breasts:    Right: Normal.     Left: Normal.     Comments: Breasts: breasts appear normal, no suspicious masses, no skin or nipple changes or axillary nodes  Abdominal:     General: There is no distension.     Palpations: Abdomen is soft.     Tenderness: There is no abdominal tenderness.  Musculoskeletal:        General: No deformity.     Cervical back: Neck supple.     Right lower leg: No edema.     Left lower leg: No edema.  Lymphadenopathy:     Cervical: No cervical adenopathy.  Skin:    General: Skin is  warm and dry.     Findings: No rash.  Neurological:     Mental Status: She is alert and oriented to person, place, and time. Mental status is at baseline.     Sensory: No sensory deficit.     Motor: No weakness.  Gait: Gait normal.  Psychiatric:        Mood and Affect: Mood normal.        Behavior: Behavior normal.        Thought Content: Thought content normal.    Last depression screening scores PHQ 2/9 Scores 12/13/2020 12/12/2019 12/09/2018  PHQ - 2 Score 0 0 0  PHQ- 9 Score 0 - 0   Last fall risk screening Fall Risk  12/13/2020  Falls in the past year? 0  Number falls in past yr: -  Injury with Fall? 0  Risk for fall due to : No Fall Risks  Follow up Falls evaluation completed   Last Audit-C alcohol use screening Alcohol Use Disorder Test (AUDIT) 12/13/2020  1. How often do you have a drink containing alcohol? 4  2. How many drinks containing alcohol do you have on a typical day when you are drinking? 0  3. How often do you have six or more drinks on one occasion? 0  AUDIT-C Score 4  4. How often during the last year have you found that you were not able to stop drinking once you had started? 0  5. How often during the last year have you failed to do what was normally expected from you because of drinking? 0  6. How often during the last year have you needed a first drink in the morning to get yourself going after a heavy drinking session? 0  7. How often during the last year have you had a feeling of guilt of remorse after drinking? 0  8. How often during the last year have you been unable to remember what happened the night before because you had been drinking? 0  9. Have you or someone else been injured as a result of your drinking? 0  10. Has a relative or friend or a doctor or another health worker been concerned about your drinking or suggested you cut down? 0  Alcohol Use Disorder Identification Test Final Score (AUDIT) 4  Alcohol Brief Interventions/Follow-up -   A  score of 3 or more in women, and 4 or more in men indicates increased risk for alcohol abuse, EXCEPT if all of the points are from question 1   No results found for any visits on 12/13/20.  Assessment & Plan     Problem List Items Addressed This Visit       Cardiovascular and Mediastinum   Essential hypertension   Relevant Orders   Comprehensive metabolic panel     Other   Hypercholesteremia   Relevant Orders   Lipid Panel With LDL/HDL Ratio   Encounter for screening mammogram for malignant neoplasm of breast   Relevant Orders   MM 3D SCREEN BREAST BILATERAL   Abnormal glucose   Other Visit Diagnoses     Routine general medical examination at a health care facility    -  Primary   Relevant Orders   Comprehensive metabolic panel   Hemoglobin A1c   Lipid Panel With LDL/HDL Ratio   Need for shingles vaccine       Relevant Orders   Varicella-zoster vaccine IM (Completed)       Routine Health Maintenance and Physical Exam  Exercise Activities and Dietary recommendations  Goals      Exercise 150 minutes per week (moderate activity)     Reduce portion size     Decrease carbohydrates.          Immunization History  Administered Date(s) Administered  Influenza, High Dose Seasonal PF 03/03/2017   Moderna Sars-Covid-2 Vaccination 10/15/2019, 11/12/2019   Td 06/23/1995, 12/02/2017   Zoster Recombinat (Shingrix) 12/13/2020    Health Maintenance  Topic Date Due   COVID-19 Vaccine (3 - Booster for Moderna series) 04/13/2020   INFLUENZA VACCINE  12/31/2020   Zoster Vaccines- Shingrix (2 of 2) 02/07/2021   Fecal DNA (Cologuard)  06/09/2021   MAMMOGRAM  03/23/2022   PAP SMEAR-Modifier  12/11/2024   TETANUS/TDAP  12/03/2027   Hepatitis C Screening  Completed   HIV Screening  Completed   Pneumococcal Vaccine 1-37 Years old  Aged Out   HPV VACCINES  Aged Out    Discussed health benefits of physical activity, and encouraged her to engage in regular exercise  appropriate for her age and condition.  Return in about 6 months (around 06/15/2021) for chronic disease f/u, With new PCP.     I,Essence Turner,acting as a Education administrator for Lavon Paganini, MD.,have documented all relevant documentation on the behalf of Lavon Paganini, MD,as directed by  Lavon Paganini, MD while in the presence of Lavon Paganini, MD.  I, Lavon Paganini, MD, have reviewed all documentation for this visit. The documentation on 12/13/20 for the exam, diagnosis, procedures, and orders are all accurate and complete.   Nalu Troublefield, Dionne Bucy, MD, MPH [facility]

## 2020-12-13 NOTE — Patient Instructions (Signed)
Recombinant Zoster (Shingles) Vaccine: What You Need to Know 1. Why get vaccinated? Recombinant zoster (shingles) vaccine can prevent shingles. Shingles (also called herpes zoster, or just zoster) is a painful skin rash, usually with blisters. In addition to the rash, shingles can cause fever, headache, chills, or upset stomach. More rarely, shingles can lead to pneumonia, hearingproblems, blindness, brain inflammation (encephalitis), or death. The most common complication of shingles is long-term nerve pain called postherpetic neuralgia (PHN). PHN occurs in the areas where the shingles rash was, even after the rash clears up. It can last for months or years after therash goes away. The pain from PHN can be severe and debilitating. About 10 to 18% of people who get shingles will experience PHN. The risk of PHN increases with age. An older adult with shingles is more likely to develop PHN and have longer lasting and more severe pain than a younger person withshingles. Shingles is caused by the varicella zoster virus, the same virus that causes chickenpox. After you have chickenpox, the virus stays in your body and can cause shingles later in life. Shingles cannot be passed from one person to another, but the virus that causes shingles can spread and cause chickenpox insomeone who had never had chickenpox or received chickenpox vaccine. 2. Recombinant shingles vaccine Recombinant shingles vaccine provides strong protection against shingles. Bypreventing shingles, recombinant shingles vaccine also protects against PHN. Recombinant shingles vaccine is the preferred vaccine for the prevention of shingles. However, a different vaccine, live shingles vaccine, may be used in somecircumstances. The recombinant shingles vaccine is recommended for adults 50 years and older without serious immune problems. It is given as a two-dose series. This vaccine is also recommended for people who have already gotten another  type of shingles vaccine, the live shingles vaccine. There is no live virus inthis vaccine. Shingles vaccine may be given at the same time as other vaccines. 3. Talk with your health care provider Tell your vaccine provider if the person getting the vaccine: Has had an allergic reaction after a previous dose of recombinant shingles vaccine, or has any severe, life-threatening allergies. Is pregnant or breastfeeding. Is currently experiencing an episode of shingles. In some cases, your health care provider may decide to postpone shinglesvaccination to a future visit. People with minor illnesses, such as a cold, may be vaccinated. People who are moderately or severely ill should usually wait until they recover beforegetting recombinant shingles vaccine. Your health care provider can give you more information. 4. Risks of a vaccine reaction A sore arm with mild or moderate pain is very common after recombinant shingles vaccine, affecting about 80% of vaccinated people. Redness and swelling can also happen at the site of the injection. Tiredness, muscle pain, headache, shivering, fever, stomach pain, and nausea happen after vaccination in more than half of people who receive recombinant shingles vaccine. In clinical trials, about 1 out of 6 people who got recombinant zoster vaccine experienced side effects that prevented them from doing regular activities.Symptoms usually went away on their own in 2 to 3 days. You should still get the second dose of recombinant zoster vaccine even if youhad one of these reactions after the first dose. People sometimes faint after medical procedures, including vaccination. Tellyour provider if you feel dizzy or have vision changes or ringing in the ears. As with any medicine, there is a very remote chance of a vaccine causing asevere allergic reaction, other serious injury, or death. 5. What if there is a serious problem? An  allergic reaction could occur after the  vaccinated person leaves the clinic. If you see signs of a severe allergic reaction (hives, swelling of the face and throat, difficulty breathing, a fast heartbeat, dizziness, or weakness), call 9-1-1 and get the person to the nearest hospital. For other signs that concern you, call your health care provider. Adverse reactions should be reported to the Vaccine Adverse Event Reporting System (VAERS). Your health care provider will usually file this report, or you can do it yourself. Visit the VAERS website at www.vaers.SamedayNews.es or call 417-493-0315. VAERS is only for reporting reactions, and VAERS staff do not give medical advice. 6. How can I learn more? Ask your health care provider. Call your local or state health department. Contact the Centers for Disease Control and Prevention (CDC): Call 442-356-3219 (1-800-CDC-INFO) or Visit CDC's website at http://hunter.com/ Vaccine Information Statement Recombinant Zoster Vaccine (03/31/2018) This information is not intended to replace advice given to you by your health care provider. Make sure you discuss any questions you have with your healthcare provider. Document Revised: 01/20/2020 Document Reviewed: 01/20/2020 Elsevier Patient Education  2022 Elsevier Inc.  Preventive Care 54-59 Years Old, Female Preventive care refers to lifestyle choices and visits with your health care provider that can promote health and wellness. This includes: A yearly physical exam. This is also called an annual wellness visit. Regular dental and eye exams. Immunizations. Screening for certain conditions. Healthy lifestyle choices, such as: Eating a healthy diet. Getting regular exercise. Not using drugs or products that contain nicotine and tobacco. Limiting alcohol use. What can I expect for my preventive care visit? Physical exam Your health care provider will check your: Height and weight. These may be used to calculate your BMI (body mass index). BMI is  a measurement that tells if you are at a healthy weight. Heart rate and blood pressure. Body temperature. Skin for abnormal spots. Counseling Your health care provider may ask you questions about your: Past medical problems. Family's medical history. Alcohol, tobacco, and drug use. Emotional well-being. Home life and relationship well-being. Sexual activity. Diet, exercise, and sleep habits. Work and work Statistician. Access to firearms. Method of birth control. Menstrual cycle. Pregnancy history. What immunizations do I need?  Vaccines are usually given at various ages, according to a schedule. Your health care provider will recommend vaccines for you based on your age, medicalhistory, and lifestyle or other factors, such as travel or where you work. What tests do I need? Blood tests Lipid and cholesterol levels. These may be checked every 5 years, or more often if you are over 75 years old. Hepatitis C test. Hepatitis B test. Screening Lung cancer screening. You may have this screening every year starting at age 62 if you have a 30-pack-year history of smoking and currently smoke or have quit within the past 15 years. Colorectal cancer screening. All adults should have this screening starting at age 12 and continuing until age 31. Your health care provider may recommend screening at age 29 if you are at increased risk. You will have tests every 1-10 years, depending on your results and the type of screening test. Diabetes screening. This is done by checking your blood sugar (glucose) after you have not eaten for a while (fasting). You may have this done every 1-3 years. Mammogram. This may be done every 1-2 years. Talk with your health care provider about when you should start having regular mammograms. This may depend on whether you have a family history of breast cancer.  BRCA-related cancer screening. This may be done if you have a family history of breast, ovarian, tubal,  or peritoneal cancers. Pelvic exam and Pap test. This may be done every 3 years starting at age 15. Starting at age 21, this may be done every 5 years if you have a Pap test in combination with an HPV test. Other tests STD (sexually transmitted disease) testing, if you are at risk. Bone density scan. This is done to screen for osteoporosis. You may have this scan if you are at high risk for osteoporosis. Talk with your health care provider about your test results, treatment options,and if necessary, the need for more tests. Follow these instructions at home: Eating and drinking  Eat a diet that includes fresh fruits and vegetables, whole grains, lean protein, and low-fat dairy products. Take vitamin and mineral supplements as recommended by your health care provider. Do not drink alcohol if: Your health care provider tells you not to drink. You are pregnant, may be pregnant, or are planning to become pregnant. If you drink alcohol: Limit how much you have to 0-1 drink a day. Be aware of how much alcohol is in your drink. In the [place] one drink equals one 12 oz bottle of beer (355 mL), one 5 oz glass of wine (148 mL), or one 1 oz glass of hard liquor (44 mL).  Lifestyle Take daily care of your teeth and gums. Brush your teeth every morning and night with fluoride toothpaste. Floss one time each day. Stay active. Exercise for at least 30 minutes 5 or more days each week. Do not use any products that contain nicotine or tobacco, such as cigarettes, e-cigarettes, and chewing tobacco. If you need help quitting, ask your health care provider. Do not use drugs. If you are sexually active, practice safe sex. Use a condom or other form of protection to prevent STIs (sexually transmitted infections). If you do not wish to become pregnant, use a form of birth control. If you plan to become pregnant, see your health care provider for a prepregnancy visit. If told by your health care provider, take  low-dose aspirin daily starting at age 41. Find healthy ways to cope with stress, such as: Meditation, yoga, or listening to music. Journaling. Talking to a trusted person. Spending time with friends and family. Safety Always wear your seat belt while driving or riding in a vehicle. Do not drive: If you have been drinking alcohol. Do not ride with someone who has been drinking. When you are tired or distracted. While texting. Wear a helmet and other protective equipment during sports activities. If you have firearms in your house, make sure you follow all gun safety procedures. What's next? Visit your health care provider once a year for an annual wellness visit. Ask your health care provider how often you should have your eyes and teeth checked. Stay up to date on all vaccines. This information is not intended to replace advice given to you by your health care provider. Make sure you discuss any questions you have with your healthcare provider. Document Revised: 02/21/2020 Document Reviewed: 01/28/2018 Elsevier Patient Education  2022 [employer].

## 2021-01-09 LAB — COMPREHENSIVE METABOLIC PANEL WITH GFR
ALT: 31 IU/L (ref 0–32)
AST: 23 IU/L (ref 0–40)
Albumin/Globulin Ratio: 1.9 (ref 1.2–2.2)
Albumin: 4.7 g/dL (ref 3.8–4.8)
Alkaline Phosphatase: 79 IU/L (ref 44–121)
BUN/Creatinine Ratio: 28 (ref 12–28)
BUN: 10 mg/dL (ref 8–27)
Bilirubin Total: 0.3 mg/dL (ref 0.0–1.2)
CO2: 21 mmol/L (ref 20–29)
Calcium: 9.4 mg/dL (ref 8.7–10.3)
Chloride: 101 mmol/L (ref 96–106)
Creatinine, Ser: 0.36 mg/dL — ABNORMAL LOW (ref 0.57–1.00)
Globulin, Total: 2.5 g/dL (ref 1.5–4.5)
Glucose: 99 mg/dL (ref 65–99)
Potassium: 4.5 mmol/L (ref 3.5–5.2)
Sodium: 142 mmol/L (ref 134–144)
Total Protein: 7.2 g/dL (ref 6.0–8.5)
eGFR: 113 mL/min/1.73

## 2021-01-09 LAB — LIPID PANEL WITH LDL/HDL RATIO
Cholesterol, Total: 204 mg/dL — ABNORMAL HIGH (ref 100–199)
HDL: 46 mg/dL
LDL Chol Calc (NIH): 84 mg/dL (ref 0–99)
LDL/HDL Ratio: 1.8 ratio (ref 0.0–3.2)
Triglycerides: 459 mg/dL — ABNORMAL HIGH (ref 0–149)
VLDL Cholesterol Cal: 74 mg/dL — ABNORMAL HIGH (ref 5–40)

## 2021-01-09 LAB — HEMOGLOBIN A1C
Est. average glucose Bld gHb Est-mCnc: 126 mg/dL
Hgb A1c MFr Bld: 6 % — ABNORMAL HIGH (ref 4.8–5.6)

## 2021-01-11 ENCOUNTER — Other Ambulatory Visit: Payer: Self-pay | Admitting: Physician Assistant

## 2021-01-11 DIAGNOSIS — I10 Essential (primary) hypertension: Secondary | ICD-10-CM

## 2021-03-17 ENCOUNTER — Other Ambulatory Visit: Payer: Self-pay | Admitting: Physician Assistant

## 2021-03-17 DIAGNOSIS — E7800 Pure hypercholesterolemia, unspecified: Secondary | ICD-10-CM

## 2021-03-25 ENCOUNTER — Other Ambulatory Visit: Payer: Self-pay

## 2021-03-25 ENCOUNTER — Ambulatory Visit
Admission: RE | Admit: 2021-03-25 | Discharge: 2021-03-25 | Disposition: A | Discharge status: Home / Self Care | Payer: Managed Care, Other (non HMO) | Source: Ambulatory Visit | Attending: Family Medicine | Admitting: Family Medicine

## 2021-03-25 DIAGNOSIS — Z1231 Encounter for screening mammogram for malignant neoplasm of breast: Secondary | ICD-10-CM | POA: Insufficient documentation

## 2021-06-17 ENCOUNTER — Other Ambulatory Visit: Payer: Self-pay

## 2021-06-17 ENCOUNTER — Ambulatory Visit (INDEPENDENT_AMBULATORY_CARE_PROVIDER_SITE_OTHER): Payer: Managed Care, Other (non HMO) | Admitting: Family Medicine

## 2021-06-17 ENCOUNTER — Encounter: Payer: Self-pay | Admitting: Family Medicine

## 2021-06-17 VITALS — BP 132/83 | HR 66 | Temp 97.9°F | Resp 16 | Wt 158.1 lb

## 2021-06-17 DIAGNOSIS — R7303 Prediabetes: Secondary | ICD-10-CM

## 2021-06-17 DIAGNOSIS — Z23 Encounter for immunization: Secondary | ICD-10-CM | POA: Diagnosis not present

## 2021-06-17 DIAGNOSIS — I10 Essential (primary) hypertension: Secondary | ICD-10-CM

## 2021-06-17 DIAGNOSIS — E7800 Pure hypercholesterolemia, unspecified: Secondary | ICD-10-CM | POA: Diagnosis not present

## 2021-06-17 NOTE — Assessment & Plan Note (Signed)
Well controlled Continue current medications Recheck metabolic panel F/u in 6 months  

## 2021-06-17 NOTE — Assessment & Plan Note (Signed)
Recommend low carb diet °Recheck A1c  °

## 2021-06-17 NOTE — Progress Notes (Signed)
Established patient visit   Patient: Kristen Wall   DOB: 02-12-1956   66 y.o. Female  MRN: [id number] Visit Date: 06/17/2021  Today's healthcare provider: Lavon Paganini, MD   Chief Complaint  Patient presents with   Hypertension   Hyperlipidemia   I,Sulibeya S Dimas,acting as a scribe for Lavon Paganini, MD.,have documented all relevant documentation on the behalf of Lavon Paganini, MD,as directed by  Lavon Paganini, MD while in the presence of Lavon Paganini, MD.  Subjective    HPI  Hypertension, follow-up  BP Readings from Last 3 Encounters:  06/17/21 132/83  12/13/20 (!) 93/59  12/12/19 111/73   Wt Readings from Last 3 Encounters:  06/17/21 158 lb 1.6 oz (71.7 kg)  12/13/20 163 lb 12.8 oz (74.3 kg)  12/12/19 156 lb (70.8 kg)     She was last seen for hypertension 6 months ago.  BP at that visit was 93/59. Management since that visit includes continue current treatment.  She reports excellent compliance with treatment. She is not having side effects.  She is following a Regular diet. She is not exercising. She does not smoke.  Outside blood pressures are not being checked. Symptoms: No chest pain No chest pressure  No palpitations No syncope  No dyspnea No orthopnea  No paroxysmal nocturnal dyspnea Yes lower extremity edema   Pertinent labs: Lab Results  Component Value Date   CHOL 204 (H) 01/08/2021   HDL 46 01/08/2021   LDLCALC 84 01/08/2021   TRIG 459 (H) 01/08/2021   CHOLHDL 3.7 12/12/2019   Lab Results  Component Value Date   NA 142 01/08/2021   K 4.5 01/08/2021   CREATININE 0.36 (L) 01/08/2021   EGFR 113 01/08/2021   GLUCOSE 99 01/08/2021   TSH 0.753 12/12/2019     The 10-year ASCVD risk score (Arnett DK, et al., 2019) is: 10%   ---------------------------------------------------------------------------------------------------  Lipid/Cholesterol, Follow-up  Last lipid panel Other pertinent labs  Lab Results   Component Value Date   CHOL 204 (H) 01/08/2021   HDL 46 01/08/2021   LDLCALC 84 01/08/2021   TRIG 459 (H) 01/08/2021   CHOLHDL 3.7 12/12/2019   Lab Results  Component Value Date   ALT 31 01/08/2021   AST 23 01/08/2021   PLT 198 12/12/2019   TSH 0.753 12/12/2019     She was last seen for this 6 months ago.  Management since that visit includes continue current treatment.  She reports excellent compliance with treatment. She is not having side effects.   Symptoms: No chest pain No chest pressure/discomfort  No dyspnea No lower extremity edema  No numbness or tingling of extremity No orthopnea  No palpitations No paroxysmal nocturnal dyspnea  No speech difficulty No syncope   Current diet: in general, a "healthy" diet   Current exercise: no regular exercise  The 10-year ASCVD risk score (Arnett DK, et al., 2019) is: 10%  ---------------------------------------------------------------------------------------------------  Medications: Outpatient Medications Prior to Visit  Medication Sig   brimonidine (ALPHAGAN P) 0.1 % SOLN    Cholecalciferol (VITAMIN D) 2000 UNITS tablet Take by mouth.   latanoprost (XALATAN) 0.005 % ophthalmic solution INSTILL 1 DROP INTO EACH EYE AT BEDTIME   lisinopril-hydrochlorothiazide (ZESTORETIC) 20-25 MG tablet Take 1 tablet by mouth once daily   MULTIPLE VITAMINS PO Take by mouth.   OMEGA 3-6-9 FATTY ACIDS PO Take by mouth.   simvastatin (ZOCOR) 20 MG tablet Take 1 tablet by mouth in the evening   [DISCONTINUED] bimatoprost (  LUMIGAN) 0.01 % SOLN Apply to eye. (Patient not taking: Reported on 12/13/2020)   No facility-administered medications prior to visit.    Review of Systems  Constitutional:  Negative for appetite change and fatigue.  Respiratory:  Negative for chest tightness and shortness of breath.   Cardiovascular:  Positive for leg swelling. Negative for chest pain and palpitations.       Objective    BP 132/83 (BP Location:  Left Arm, Patient Position: Sitting, Cuff Size: Large)    Pulse 66    Temp 97.9 F (36.6 C) (Oral)    Resp 16    Wt 158 lb 1.6 oz (71.7 kg)    BMI 28.92 kg/m  BP Readings from Last 3 Encounters:  06/17/21 132/83  12/13/20 (!) 93/59  12/12/19 111/73   Wt Readings from Last 3 Encounters:  06/17/21 158 lb 1.6 oz (71.7 kg)  12/13/20 163 lb 12.8 oz (74.3 kg)  12/12/19 156 lb (70.8 kg)      Physical Exam Vitals reviewed.  Constitutional:      General: She is not in acute distress.    Appearance: Normal appearance. She is well-developed. She is not diaphoretic.  HENT:     Head: Normocephalic and atraumatic.  Eyes:     General: No scleral icterus.    Conjunctiva/sclera: Conjunctivae normal.  Neck:     Thyroid: No thyromegaly.  Cardiovascular:     Rate and Rhythm: Normal rate and regular rhythm.     Pulses: Normal pulses.     Heart sounds: Normal heart sounds. No murmur heard. Pulmonary:     Effort: Pulmonary effort is normal. No respiratory distress.     Breath sounds: Normal breath sounds. No wheezing, rhonchi or rales.  Musculoskeletal:     Cervical back: Neck supple.     Right lower leg: No edema.     Left lower leg: No edema.  Lymphadenopathy:     Cervical: No cervical adenopathy.  Skin:    General: Skin is warm and dry.     Findings: No rash.  Neurological:     Mental Status: She is alert and oriented to person, place, and time. Mental status is at baseline.  Psychiatric:        Mood and Affect: Mood normal.        Behavior: Behavior normal.      No results found for any visits on 06/17/21.  Assessment & Plan     Problem List Items Addressed This Visit       Cardiovascular and Mediastinum   Essential hypertension - Primary    Well controlled Continue current medications Recheck metabolic panel F/u in 6 months       Relevant Orders   Basic Metabolic Panel (BMET)     Other   Hypercholesteremia    Previously well controlled Continue statin Repeat FLP  and CMP at next visit (annually)      Prediabetes    Recommend low carb diet Recheck A1c      Relevant Orders   Hemoglobin A1c   Other Visit Diagnoses     Need for shingles vaccine       Relevant Orders   Varicella-zoster vaccine IM   Need for influenza vaccination       Relevant Orders   Flu Vaccine QUAD 72mo+IM (Fluarix, Fluzone & Alfiuria Quad PF)        Return in about 6 months (around 12/15/2021) for chronic disease f/u.      I, Lavon Paganini,  MD, have reviewed all documentation for this visit. The documentation on 06/17/21 for the exam, diagnosis, procedures, and orders are all accurate and complete.   Grayer Sproles, Dionne Bucy, MD, MPH [facility]

## 2021-06-17 NOTE — Assessment & Plan Note (Signed)
Previously well controlled Continue statin Repeat FLP and CMP at next visit (annually)

## 2021-06-18 LAB — BASIC METABOLIC PANEL
BUN/Creatinine Ratio: 23 (ref 12–28)
BUN: 13 mg/dL (ref 8–27)
CO2: 25 mmol/L (ref 20–29)
Calcium: 9.9 mg/dL (ref 8.7–10.3)
Chloride: 98 mmol/L (ref 96–106)
Creatinine, Ser: 0.57 mg/dL (ref 0.57–1.00)
Glucose: 104 mg/dL — ABNORMAL HIGH (ref 70–99)
Potassium: 4.6 mmol/L (ref 3.5–5.2)
Sodium: 139 mmol/L (ref 134–144)
eGFR: 101 mL/min/{1.73_m2} (ref >59)

## 2021-06-18 LAB — HEMOGLOBIN A1C
Est. average glucose Bld gHb Est-mCnc: 120 mg/dL
Hgb A1c MFr Bld: 5.8 % — ABNORMAL HIGH (ref 4.8–5.6)

## 2021-10-18 ENCOUNTER — Encounter: Payer: Managed Care, Other (non HMO) | Admitting: Family Medicine

## 2021-12-16 ENCOUNTER — Encounter: Payer: Managed Care, Other (non HMO) | Admitting: Family Medicine

## 2021-12-30 ENCOUNTER — Ambulatory Visit (INDEPENDENT_AMBULATORY_CARE_PROVIDER_SITE_OTHER): Payer: Managed Care, Other (non HMO) | Admitting: Physician Assistant

## 2021-12-30 ENCOUNTER — Encounter: Payer: Self-pay | Admitting: Physician Assistant

## 2021-12-30 VITALS — BP 138/59 | HR 73 | Temp 98.2°F | Resp 16 | Wt 157.7 lb

## 2021-12-30 DIAGNOSIS — R7303 Prediabetes: Secondary | ICD-10-CM

## 2021-12-30 DIAGNOSIS — E7800 Pure hypercholesterolemia, unspecified: Secondary | ICD-10-CM

## 2021-12-30 DIAGNOSIS — I10 Essential (primary) hypertension: Secondary | ICD-10-CM

## 2021-12-30 DIAGNOSIS — Z23 Encounter for immunization: Secondary | ICD-10-CM

## 2021-12-30 DIAGNOSIS — Z0000 Encounter for general adult medical examination without abnormal findings: Secondary | ICD-10-CM | POA: Diagnosis not present

## 2021-12-30 DIAGNOSIS — Z1211 Encounter for screening for malignant neoplasm of colon: Secondary | ICD-10-CM

## 2021-12-30 DIAGNOSIS — E2839 Other primary ovarian failure: Secondary | ICD-10-CM

## 2021-12-30 DIAGNOSIS — M7989 Other specified soft tissue disorders: Secondary | ICD-10-CM

## 2021-12-30 DIAGNOSIS — Z1231 Encounter for screening mammogram for malignant neoplasm of breast: Secondary | ICD-10-CM

## 2021-12-30 NOTE — Progress Notes (Signed)
I,Maie Kesinger Robinson,acting as a Education administrator for [employer], PA-C.,have documented all relevant documentation on the behalf of [name], PA-C,as directed by  [name], PA-C while in the presence of [name], PA-C. '  Complete physical exam   Patient: Kristen Wall   DOB: 05/31/1956   66 y.o. Female  MRN: [id number] Visit Date: 12/30/2021  Today's healthcare provider: [name], PA-C   Chief Complaint  Patient presents with   Annual Exam   Subjective    Kristen Wall is a 66 y.o. female who presents today for a complete physical exam.  She reports consuming a general diet. The patient does not participate in regular exercise at present. She generally feels well. She reports sleeping well. She does have additional problems to discuss today.  Reports right shoulder muscle pain x 3 wks/only when leaning down/ Pt attributes it to her extensive use of computer at work.   Also, feet and ankle swelling x 1 month.   No past medical history on file. Past Surgical History:  Procedure Laterality Date   TUBAL LIGATION  1995   Social History   Socioeconomic History   Marital status: Married    Spouse name: [name]   Number of children: 2   Years of education: College   Highest education level: Not on file  Occupational History   Occupation: [employer]    Comment: Full-Time  Tobacco Use   Smoking status: Former    Types: Cigarettes    Quit date: 06/03/2007    Years since quitting: 14.5   Smokeless tobacco: Never  Vaping Use   Vaping Use: Never used  Substance and Sexual Activity   Alcohol use: Yes    Comment: Occasional, Drinks beer for social events.   Drug use: No   Sexual activity: Not on file  Other Topics Concern   Not on file  Social History Narrative   Not on file   Social Determinants of Health   Financial Resource Strain: Not on file  Food Insecurity: Not on file  Transportation Needs: Not on file  Physical Activity: Not on file  Stress: Not on  file  Social Connections: Not on file  Intimate Partner Violence: Not on file   Family Status  Relation Name Status   Mother  Deceased at age 79   Father  Deceased at age 11   Brother  Deceased at age 56   Daughter  79   Son  [place]  Deceased   [place]   Other neice [place]   Family History  Problem Relation Age of Onset   Arthritis Mother    Diabetes Mother    Hypertension Mother    Coronary artery disease Mother    Esophageal cancer Father    Heart attack Brother        Age of death: 35   Hypertension Daughter    Heart attack Maternal Grandmother    Breast cancer Cousin        3 pat cousins   Breast cancer Other 60       2 nieces   No Known Allergies  Patient Care Team: Virginia Crews, MD as PCP - General (Family Medicine)   Medications: Outpatient Medications Prior to Visit  Medication Sig   brimonidine (ALPHAGAN P) 0.1 % SOLN    Cholecalciferol (VITAMIN D) 2000 UNITS tablet Take by mouth.   latanoprost (XALATAN) 0.005 % ophthalmic solution INSTILL 1 DROP INTO EACH EYE AT BEDTIME  lisinopril-hydrochlorothiazide (ZESTORETIC) 20-25 MG tablet Take 1 tablet by mouth once daily   MULTIPLE VITAMINS PO Take by mouth.   OMEGA 3-6-9 FATTY ACIDS PO Take by mouth.   simvastatin (ZOCOR) 20 MG tablet Take 1 tablet by mouth in the evening   No facility-administered medications prior to visit.    Review of Systems  Cardiovascular:  Positive for leg swelling.  Musculoskeletal:  Positive for myalgias.  All other systems reviewed and are negative.  Except See HPI   Objective     BP (!) 138/59 (BP Location: Left Arm, Patient Position: Sitting, Cuff Size: Normal)   Pulse 73   Temp 98.2 F (36.8 C) (Temporal)   Resp 16   Wt 157 lb 11.2 oz (71.5 kg)   SpO2 98%   BMI 28.84 kg/m     Physical Exam Vitals reviewed.  Constitutional:      General: She is not in acute distress.    Appearance: Normal appearance. She is well-developed. She is not  diaphoretic.  HENT:     Head: Normocephalic and atraumatic.     Right Ear: Tympanic membrane, ear canal and external ear normal.     Left Ear: Tympanic membrane, ear canal and external ear normal.     Nose: Nose normal.     Mouth/Throat:     Mouth: Mucous membranes are moist.     Pharynx: Oropharynx is clear. No oropharyngeal exudate.  Eyes:     General: No scleral icterus.    Extraocular Movements: Extraocular movements intact.     Conjunctiva/sclera: Conjunctivae normal.     Pupils: Pupils are equal, round, and reactive to light.  Neck:     Thyroid: No thyromegaly.  Cardiovascular:     Rate and Rhythm: Normal rate and regular rhythm.     Pulses: Normal pulses.     Heart sounds: Normal heart sounds. No murmur heard. Pulmonary:     Effort: Pulmonary effort is normal. No respiratory distress.     Breath sounds: Normal breath sounds. No wheezing or rales.  Abdominal:     General: There is no distension.     Palpations: Abdomen is soft.     Tenderness: There is no abdominal tenderness.  Musculoskeletal:        General: No swelling or deformity. Normal range of motion.     Cervical back: Normal range of motion and neck supple.     Right lower leg: No edema.     Left lower leg: No edema.  Lymphadenopathy:     Cervical: No cervical adenopathy.  Skin:    General: Skin is warm and dry.     Findings: No rash.  Neurological:     General: No focal deficit present.     Mental Status: She is alert and oriented to person, place, and time. Mental status is at baseline.     Cranial Nerves: No cranial nerve deficit.     Sensory: No sensory deficit.     Motor: No weakness.     Coordination: Coordination normal.     Gait: Gait normal.     Deep Tendon Reflexes: Reflexes normal.  Psychiatric:        Behavior: Behavior normal.        Thought Content: Thought content normal.        Judgment: Judgment normal.       Last depression screening scores    12/30/2021    1:54 PM 12/13/2020     2:13 PM 12/12/2019  2:18 PM  PHQ 2/9 Scores  PHQ - 2 Score 0 0 0  PHQ- 9 Score 0 0    Last fall risk screening    12/30/2021    1:54 PM  [place] in the past year? 0  Number falls in past yr: 0  Injury with Fall? 0  Follow up Falls evaluation completed   Last Audit-C alcohol use screening    12/30/2021    1:54 PM  Alcohol Use Disorder Test (AUDIT)  1. How often do you have a drink containing alcohol? 2  2. How many drinks containing alcohol do you have on a typical day when you are drinking? 0  3. How often do you have six or more drinks on one occasion? 1  AUDIT-C Score 3   A score of 3 or more in women, and 4 or more in men indicates increased risk for alcohol abuse, EXCEPT if all of the points are from question 1   No results found for any visits on 12/30/21.  Assessment & Plan    Routine Health Maintenance and Physical Exam  Exercise Activities and Dietary recommendations  Goals      Exercise 150 minutes per week (moderate activity)     Reduce portion size     Decrease carbohydrates.         Immunization History  Administered Date(s) Administered   Influenza, High Dose Seasonal PF 03/03/2017   Influenza,inj,Quad PF,6+ Mos 06/17/2021   Moderna Sars-Covid-2 Vaccination 10/15/2019, 11/12/2019   Td 06/23/1995, 12/02/2017   Zoster Recombinat (Shingrix) 12/13/2020, 06/17/2021    Health Maintenance  Topic Date Due   COVID-19 Vaccine (3 - Moderna series) 01/07/2020   Fecal DNA (Cologuard)  06/09/2021   Pneumonia Vaccine 74+ Years old (1 - PCV) Never done   DEXA SCAN  Never done   INFLUENZA VACCINE  12/31/2021   MAMMOGRAM  03/26/2023   PAP SMEAR-Modifier  12/11/2024   TETANUS/TDAP  12/03/2027   Hepatitis C Screening  Completed   HIV Screening  Completed   Zoster Vaccines- Shingrix  Completed   HPV VACCINES  Aged Out    Discussed health benefits of physical activity, and encouraged her to engage in regular exercise appropriate for her age and  condition.  1. Essential hypertension BP 003/70 - Basic metabolic panel - Lipid panel Lifestyle modifications and continue med regimen/zestoretic  2. Hypercholesteremia Chronic. Stable - Lipid panel Continue ZOCOR Lifestyle modifications encouraged   3. Prediabetes Chronic. - Hemoglobin A1c Can try to use medication for prediabetes and weight restrictions  4. Annual physical exam  - Hemoglobin W8G - Basic metabolic panel - Lipid panel  5. Breast cancer screening by mammogram  - MM 3D Screening Bilateral; Future  6. Colon cancer screening  - Cologuard  7. Estrogen deficiency  - DG Bone density [place]; Future  8. Need for vaccination against Streptococcus pneumoniae  - Pneumococcal conjugate vaccine 20-valent (PCV20) Vaccine was administered by Lenetta Quaker, CMA  9. Leg swelling Could be due to venous insufficiency Compression soaks, keep raising legs recommended  10. Right shoulder pain Ice/heat/raise/massage/stretching exercise advised? Ergonomic adjustment at work  FU as scheduled  The patient was advised to call back or seek an in-person evaluation if the symptoms worsen or if the condition fails to improve as anticipated.  I discussed the assessment and treatment plan with the patient. The patient was provided an opportunity to ask questions and all were answered. The patient agreed with the plan and demonstrated  an understanding of the instructions.  The entirety of the information documented in the History of Present Illness, Review of Systems and Physical Exam were personally obtained by me. Portions of this information were initially documented by the CMA and reviewed by me for thoroughness and accuracy.  Portions of this note were created using dictation software and may contain typographical errors.     [name], PA-C  [facility] [phone] (phone) [phone] (fax)  [place]

## 2021-12-31 LAB — LIPID PANEL
Chol/HDL Ratio: 2.8 ratio (ref 0.0–4.4)
Cholesterol, Total: 206 mg/dL — ABNORMAL HIGH (ref 100–199)
HDL: 73 mg/dL (ref >39)
LDL Chol Calc (NIH): 104 mg/dL — ABNORMAL HIGH (ref 0–99)
Triglycerides: 170 mg/dL — ABNORMAL HIGH (ref 0–149)
VLDL Cholesterol Cal: 29 mg/dL (ref 5–40)

## 2021-12-31 LAB — BASIC METABOLIC PANEL
BUN/Creatinine Ratio: 18 (ref 12–28)
BUN: 11 mg/dL (ref 8–27)
CO2: 25 mmol/L (ref 20–29)
Calcium: 9.8 mg/dL (ref 8.7–10.3)
Chloride: 100 mmol/L (ref 96–106)
Creatinine, Ser: 0.6 mg/dL (ref 0.57–1.00)
Glucose: 91 mg/dL (ref 70–99)
Potassium: 4.9 mmol/L (ref 3.5–5.2)
Sodium: 141 mmol/L (ref 134–144)
eGFR: 100 mL/min/{1.73_m2} (ref >59)

## 2021-12-31 LAB — HEMOGLOBIN A1C
Est. average glucose Bld gHb Est-mCnc: 120 mg/dL
Hgb A1c MFr Bld: 5.8 % — ABNORMAL HIGH (ref 4.8–5.6)

## 2022-01-16 ENCOUNTER — Ambulatory Visit: Payer: Self-pay

## 2022-01-16 NOTE — Progress Notes (Signed)
I,Kristen Wall,acting as a Education administrator for [employer], PA-C.,have documented all relevant documentation on the behalf of Kristen Speak, PA-C,as directed by  [employer], PA-C while in the presence of [employer], PA-C.  Established patient visit   Patient: Kristen Wall   DOB: 06-08-1955   66 y.o. Female  MRN: [id number] Visit Date: 01/17/2022  Today's healthcare provider: [name], PA-C   CC: right foot pain  Subjective    Patient presents for right foot pain/entire foot with redness and swelling for 1 week and worsening in the past two days causing patient to limp. No known injury.  Taking Tylenol and using hot patches with some relief.    Medications: Outpatient Medications Prior to Visit  Medication Sig   brimonidine (ALPHAGAN P) 0.1 % SOLN    Cholecalciferol (VITAMIN D) 2000 UNITS tablet Take by mouth.   latanoprost (XALATAN) 0.005 % ophthalmic solution INSTILL 1 DROP INTO EACH EYE AT BEDTIME   lisinopril-hydrochlorothiazide (ZESTORETIC) 20-25 MG tablet Take 1 tablet by mouth once daily   MULTIPLE VITAMINS PO Take by mouth.   OMEGA 3-6-9 FATTY ACIDS PO Take by mouth.   simvastatin (ZOCOR) 20 MG tablet Take 1 tablet by mouth in the evening   No facility-administered medications prior to visit.    Review of Systems  All other systems reviewed and are negative. See HPI     Objective    There were no vitals taken for this visit.  Vitals:   01/17/22 1056 01/17/22 1057  BP: (!) 154/82 (!) 146/86  Pulse: 70   Resp: 16   Temp: 98.2 F (36.8 C)   SpO2: 98%   The 10-year ASCVD risk score (Arnett DK, et al., 2019) is: 11.9%   Physical Exam Vitals reviewed.  Constitutional:      General: She is not in acute distress.    Appearance: Normal appearance. She is well-developed. She is not diaphoretic.  HENT:     Head: Normocephalic and atraumatic.  Eyes:     General: No scleral icterus.    Conjunctiva/sclera: Conjunctivae normal.  Neck:     Thyroid: No  thyromegaly.  Cardiovascular:     Rate and Rhythm: Normal rate and regular rhythm.     Pulses: Normal pulses.     Heart sounds: Normal heart sounds. No murmur heard. Pulmonary:     Effort: Pulmonary effort is normal. No respiratory distress.     Breath sounds: Normal breath sounds. No wheezing, rhonchi or rales.  Musculoskeletal:        General: Swelling present. No tenderness.     Cervical back: Neck supple.     Right lower leg: Edema present.     Left lower leg: No edema.     Comments: Cannot properly move her right foot 2/2 pain  Lymphadenopathy:     Cervical: No cervical adenopathy.  Skin:    General: Skin is warm and dry.     Findings: No rash.  Neurological:     Mental Status: She is alert and oriented to person, place, and time. Mental status is at baseline.  Psychiatric:        Mood and Affect: Mood normal.        Behavior: Behavior normal.       No results found for any visits on 01/17/22.  Assessment & Plan     1. Foot pain, right With swelling and warmth on touch - CBC with Differential/Platelet - Sed Rate (ESR) - C-reactive protein -  DG Foot Complete Right; Future - sulfamethoxazole-trimethoprim (BACTRIM DS) 800-160 MG tablet; Take 1 tablet by mouth 2 (two) times daily.  Dispense: 12 tablet; Refill: 0 XR foot from 12/14/12 showed: ' bony density adjacent to the  cuboid. This could be an accessory ossicle or an avulsed bony fragment."  2. Essential hypertension BP 146/86, did not take her medication this morning - CBC with Differential/Platelet - Sed Rate (ESR) - C-reactive protein - DG Foot Complete Right; Future - sulfamethoxazole-trimethoprim (BACTRIM DS) 800-160 MG tablet; Take 1 tablet by mouth 2 (two) times daily.  Dispense: 12 tablet; Refill: 0  3. Hypercholesteremia Chronic and stable Continue to take Crestor  4. Prediabetes Chronic and stable Lifestyle modifications are advised  5. Leg swelling  - CBC with Differential/Platelet - Sed  Rate (ESR) - C-reactive protein - DG Foot Complete Right; Future - sulfamethoxazole-trimethoprim (BACTRIM DS) 800-160 MG tablet; Take 1 tablet by mouth 2 (two) times daily.  Dispense: 12 tablet; Refill: 0  FU as scheduled    The patient was advised to call back or seek an in-person evaluation if the symptoms worsen or if the condition fails to improve as anticipated.  I discussed the assessment and treatment plan with the patient. The patient was provided an opportunity to ask questions and all were answered. The patient agreed with the plan and demonstrated an understanding of the instructions.  The entirety of the information documented in the History of Present Illness, Review of Systems and Physical Exam were personally obtained by me. Portions of this information were initially documented by the CMA and reviewed by me for thoroughness and accuracy.  Portions of this note were created using dictation software and may contain typographical errors.   Kristen Speak, PA-C  [facility] [phone] (phone) [phone] (fax)  [place]

## 2022-01-16 NOTE — Telephone Encounter (Signed)
  Chief Complaint: foot pain  Symptoms: R foot pain, 6/10, redness and swelling Frequency: 1 week but gotten worse 2 days ago Pertinent Negatives: NA Disposition: '[]'$ ED /'[]'$ Urgent Care (no appt availability in office) / '[x]'$ Appointment(In office/virtual)/ '[]'$  [place] [facility]/ '[]'$ Home Care/ '[]'$ Refused Recommended Disposition /'[]'$ [place] [facility]/ '[]'$  Follow-up with PCP Additional Notes: pt feels like she may have gout in R foot, never been diagnosed. Scheduled appt for tomorrow at 1040 with [place], [place].   Reason for Disposition  [1] Swollen foot AND [2] no fever  (Exceptions: localized bump from bunions, calluses, insect bite, sting)  Answer Assessment - Initial Assessment Questions 1. ONSET: "When did the pain start?"      Last week gotten worse 2 days ago  2. LOCATION: "Where is the pain located?"      R foot  3. PAIN: "How bad is the pain?"    (Scale 1-10; or mild, moderate, severe)  - MILD (1-3): doesn't interfere with normal activities.   - MODERATE (4-7): interferes with normal activities (e.g., work or school) or awakens from sleep, limping.   - SEVERE (8-10): excruciating pain, unable to do any normal activities, unable to walk.      6 6. OTHER SYMPTOMS: "Do you have any other symptoms?" (e.g., leg pain, rash, fever, numbness)     Redness, swelling  Protocols used: Foot Pain-A-AH

## 2022-01-17 ENCOUNTER — Ambulatory Visit
Admission: RE | Admit: 2022-01-17 | Discharge: 2022-01-17 | Disposition: A | Discharge status: Home / Self Care | Payer: Managed Care, Other (non HMO) | Attending: *Deleted | Admitting: *Deleted

## 2022-01-17 ENCOUNTER — Ambulatory Visit (INDEPENDENT_AMBULATORY_CARE_PROVIDER_SITE_OTHER): Payer: Managed Care, Other (non HMO) | Admitting: Physician Assistant

## 2022-01-17 ENCOUNTER — Ambulatory Visit
Admission: RE | Admit: 2022-01-17 | Discharge: 2022-01-17 | Disposition: A | Discharge status: Home / Self Care | Payer: Managed Care, Other (non HMO) | Source: Ambulatory Visit | Attending: Physician Assistant | Admitting: *Deleted

## 2022-01-17 ENCOUNTER — Encounter: Payer: Self-pay | Admitting: Physician Assistant

## 2022-01-17 VITALS — BP 146/86 | HR 70 | Temp 98.2°F | Resp 16 | Wt 157.9 lb

## 2022-01-17 DIAGNOSIS — R7303 Prediabetes: Secondary | ICD-10-CM | POA: Diagnosis not present

## 2022-01-17 DIAGNOSIS — M7989 Other specified soft tissue disorders: Secondary | ICD-10-CM

## 2022-01-17 DIAGNOSIS — M79671 Pain in right foot: Secondary | ICD-10-CM | POA: Insufficient documentation

## 2022-01-17 DIAGNOSIS — E7800 Pure hypercholesterolemia, unspecified: Secondary | ICD-10-CM | POA: Diagnosis not present

## 2022-01-17 DIAGNOSIS — I10 Essential (primary) hypertension: Secondary | ICD-10-CM

## 2022-01-17 MED ORDER — SULFAMETHOXAZOLE-TRIMETHOPRIM 800-160 MG PO TABS: 1.0000 | ORAL_TABLET | Freq: Two times a day (BID) | ORAL | 0 refills | Status: DC

## 2022-01-18 LAB — CBC WITH DIFFERENTIAL/PLATELET
Basophils Absolute: 0 10*3/uL (ref 0.0–0.2)
Basos: 0 %
EOS (ABSOLUTE): 0 10*3/uL (ref 0.0–0.4)
Eos: 0 %
Hematocrit: 40.2 % (ref 34.0–46.6)
Hemoglobin: 13.5 g/dL (ref 11.1–15.9)
Immature Grans (Abs): 0 10*3/uL (ref 0.0–0.1)
Immature Granulocytes: 0 %
Lymphocytes Absolute: 2 10*3/uL (ref 0.7–3.1)
Lymphs: 20 %
MCH: 31.6 pg (ref 26.6–33.0)
MCHC: 33.6 g/dL (ref 31.5–35.7)
MCV: 94 fL (ref 79–97)
Monocytes Absolute: 0.7 10*3/uL (ref 0.1–0.9)
Monocytes: 7 %
Neutrophils Absolute: 6.9 10*3/uL (ref 1.4–7.0)
Neutrophils: 73 %
Platelets: 391 10*3/uL (ref 150–450)
RBC: 4.27 x10E6/uL (ref 3.77–5.28)
RDW: 12.9 % (ref 11.7–15.4)
WBC: 9.7 10*3/uL (ref 3.4–10.8)

## 2022-01-18 LAB — SEDIMENTATION RATE: Sed Rate: 19 mm/hr (ref 0–40)

## 2022-01-18 LAB — C-REACTIVE PROTEIN: CRP: 39 mg/L — ABNORMAL HIGH (ref 0–10)

## 2022-01-19 ENCOUNTER — Ambulatory Visit
Admission: EM | Admit: 2022-01-19 | Discharge: 2022-01-19 | Disposition: A | Discharge status: Home / Self Care | Payer: Managed Care, Other (non HMO) | Attending: Internal Medicine | Admitting: Internal Medicine

## 2022-01-19 ENCOUNTER — Encounter: Payer: Self-pay | Admitting: Emergency Medicine

## 2022-01-19 DIAGNOSIS — M79674 Pain in right toe(s): Secondary | ICD-10-CM | POA: Diagnosis present

## 2022-01-19 DIAGNOSIS — M1000 Idiopathic gout, unspecified site: Secondary | ICD-10-CM | POA: Diagnosis present

## 2022-01-19 LAB — URIC ACID: Uric Acid, Serum: 7.7 mg/dL — ABNORMAL HIGH (ref 2.5–7.1)

## 2022-01-19 MED ORDER — INDOMETHACIN 50 MG PO CAPS: 50.0000 mg | ORAL_CAPSULE | Freq: Three times a day (TID) | ORAL | 0 refills | Status: DC

## 2022-01-19 NOTE — Discharge Instructions (Signed)
Have your primary care doctor review your uric acid results when you talk with her about your elevated CRP

## 2022-01-19 NOTE — ED Triage Notes (Signed)
Patient c/o right foot pain and right big toe pain that started a week ago.  Patient denies injury or fall.

## 2022-01-19 NOTE — ED Provider Notes (Signed)
[facility]    CSN: [id number] Arrival date & time: 01/19/22  1139      History   Chief Complaint Chief Complaint  Patient presents with   Foot Pain    right    HPI Kristen Wall is a 66 y.o. female who presents with R foot and great toe pain x 1 week and denies an injury. She saw her PCP 3 days ago and had labs done, but not uric acid since she did not suspect that. Also had xray, but the report has not been read. She admits of eating more meat and cold cuts in the past week.  She was told by her PCP to take Advil, but she has been taking Tylenol instead.    History reviewed. No pertinent past medical history.  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 Authorizing Provider  indomethacin (INDOCIN) 50 MG capsule Take 1 capsule (50 mg total) by mouth 3 (three) times daily with meals. 01/19/22  Yes Rodriguez-Southworth, Sunday Spillers, PA-C  latanoprost (XALATAN) 0.005 % ophthalmic solution INSTILL 1 DROP INTO EACH EYE AT BEDTIME 11/11/17  Yes [provider]   lisinopril-hydrochlorothiazide (ZESTORETIC) 20-25 MG tablet Take 1 tablet by mouth once daily 01/14/21  Yes Bacigalupo, Dionne Bucy, MD  MULTIPLE VITAMINS PO Take by mouth.   Yes [provider]  OMEGA 3-6-9 FATTY ACIDS PO Take by mouth.   Yes [provider]  brimonidine (ALPHAGAN P) 0.1 % SOLN     [provider]  Cholecalciferol (VITAMIN D) 2000 UNITS tablet Take by mouth.    [provider]  simvastatin (ZOCOR) 20 MG tablet Take 1 tablet by mouth in the evening 03/18/21   Bacigalupo, Dionne Bucy, MD  sulfamethoxazole-trimethoprim (BACTRIM DS) 800-160 MG tablet Take 1 tablet by mouth 2 (two) times daily. 01/17/22   [name], PA-C    Family History Family History  Problem Relation Age of Onset   Arthritis Mother    Diabetes Mother    Hypertension Mother    Coronary artery disease Mother    Esophageal cancer Father    Heart attack Brother        Age of death: 34   Hypertension Daughter    Heart attack Maternal Grandmother    Breast cancer  Cousin        3 pat cousins   Breast cancer Other 49       2 nieces    Social History Social History   Tobacco Use   Smoking status: Former    Types: Cigarettes    Quit date: 06/03/2007    Years since quitting: 14.6   Smokeless tobacco: Never  Vaping Use   Vaping Use: Never used  Substance Use Topics   Alcohol use: Yes    Comment: Occasional, Drinks beer for social events.   Drug use: No     Allergies   Patient has no known allergies.   Review of Systems Review of Systems  Musculoskeletal:  Positive for gait problem and joint swelling.  Skin:  Positive for color change. Negative for rash and wound.     Physical Exam Triage Vital Signs ED Triage Vitals  Enc Vitals Group     BP 01/19/22 1205 132/70     Pulse Rate 01/19/22 1205 74     Resp 01/19/22 1205 14     Temp 01/19/22 1205 98.2 F (36.8 C)     Temp Source 01/19/22 1205 Oral     SpO2 01/19/22 1205 98 %     Weight 01/19/22 1204  158 lb (71.7 kg)     Height 01/19/22 1204 '5\' 2"'$  (1.575 m)     Head Circumference --      Peak Flow --      Pain Score 01/19/22 1204 2     Pain Loc --      Pain Edu? --      Excl. in [place]? --    No data found.  Updated Vital Signs BP 132/70 (BP Location: Left Arm)   Pulse 74   Temp 98.2 F (36.8 C) (Oral)   Resp 14   Ht '5\' 2"'$  (1.575 m)   Wt 158 lb (71.7 kg)   SpO2 98%   BMI 28.90 kg/m   Visual Acuity Right Eye Distance:   Left Eye Distance:   Bilateral Distance:    Right Eye Near:   Left Eye Near:    Bilateral Near:     Physical Exam Vitals and nursing note reviewed.  Constitutional:      General: She is not in acute distress.    Appearance: She is normal weight. She is not toxic-appearing.  Eyes:     General: No scleral icterus.    Conjunctiva/sclera: Conjunctivae normal.  Cardiovascular:     Pulses: Normal pulses.  Pulmonary:     Effort: Pulmonary effort is normal.  Musculoskeletal:     Cervical back: Neck supple.     Comments: R FOOT with mild swelling on distal foot area specially at the proximal great toe joint region where she is very tender, and has warmth. The toe does not hurt at all with palpation.   Skin:    General: Skin is warm and dry.     Findings: No bruising, erythema, lesion or rash.     Comments: No open wounds noted on R foot  Neurological:     Mental Status: She is alert and oriented to person, place, and time.     Gait: Gait abnormal.     Comments: Has slight limp  Psychiatric:        Mood and Affect: Mood normal.        Behavior: Behavior normal.        Thought Content: Thought content normal.  Judgment: Judgment normal.      UC Treatments / Results  Labs (all labs ordered are listed, but only abnormal results are displayed) Labs Reviewed  URIC ACID    EKG   Radiology No results found.  Procedures Procedures (including critical care time)  Medications Ordered in UC Medications - No data to display  Initial  Impression / Assessment and Plan / UC Course  I have reviewed the triage vital signs and the nursing notes.  I reviewed the labs she had with PCP and CBC was normal, Sed rate normal, and CRP elevated. Xray report has not been posted.   I ordered a Uric acid and started her on Indocin as noted See instructions.    Final Clinical Impressions(s) / UC Diagnoses   Final diagnoses:  Acute idiopathic gout, unspecified site     Discharge Instructions      Have your primary care doctor review your uric acid results when you talk with her about your elevated CRP     ED Prescriptions     Medication Sig Dispense Auth. Provider   indomethacin (INDOCIN) 50 MG capsule Take 1 capsule (50 mg total) by mouth 3 (three) times daily with meals. 21 capsule Rodriguez-Southworth, Sunday Spillers, PA-C      PDMP not reviewed this encounter.   Shelby Mattocks, [place] 01/19/22 1241

## 2022-01-20 ENCOUNTER — Telehealth: Payer: Self-pay | Admitting: Physician Assistant

## 2022-01-20 NOTE — Progress Notes (Signed)
Left a message regarding results of labs and xray as well as possible referral to podiatry

## 2022-01-21 ENCOUNTER — Telehealth: Payer: Self-pay

## 2022-01-21 NOTE — Telephone Encounter (Signed)
Copied from [place] [id number]. Topic: General - Other >> Jan 21, 2022  4:18 PM Everette C wrote: Reason for CRM: The patient would like to be contacted to review lab results from 01/19/22 when possible  Please contact further

## 2022-01-22 NOTE — Telephone Encounter (Signed)
Pt states she went to UC in [place] on Sunday because she was still not feeling well. She was dx with gout and given Rx. She is requesting you view those labs (in chart) and let her know if she needs to see a podiatrist.  Uric Acid, Serum 7.7

## 2022-01-23 ENCOUNTER — Other Ambulatory Visit: Payer: Self-pay | Admitting: Physician Assistant

## 2022-01-23 ENCOUNTER — Telehealth: Payer: Self-pay

## 2022-01-23 DIAGNOSIS — M79671 Pain in right foot: Secondary | ICD-10-CM

## 2022-01-23 DIAGNOSIS — M7989 Other specified soft tissue disorders: Secondary | ICD-10-CM

## 2022-01-23 NOTE — Telephone Encounter (Signed)
Pt advised of result and referral to podiatry.

## 2022-01-23 NOTE — Progress Notes (Unsigned)
Please, let pt know that referral to podiatry was placed

## 2022-01-23 NOTE — Telephone Encounter (Signed)
Copied from [place] [id number]. Topic: General - Call Back - No Documentation >> Jan 23, 2022 12:01 PM Sabas Sous wrote: Reason for CRM: Pt called and wants to discuss the results from her recent labs. She says this is the lab work that was done in [place]. Wants to get a podiatry referral but must discuss labs first.

## 2022-01-24 ENCOUNTER — Ambulatory Visit: Payer: Managed Care, Other (non HMO) | Admitting: Physician Assistant

## 2022-02-03 LAB — COLOGUARD: COLOGUARD: POSITIVE — AB

## 2022-02-04 ENCOUNTER — Other Ambulatory Visit: Payer: Self-pay

## 2022-02-04 ENCOUNTER — Other Ambulatory Visit: Payer: Self-pay | Admitting: Physician Assistant

## 2022-02-04 DIAGNOSIS — R195 Other fecal abnormalities: Secondary | ICD-10-CM

## 2022-02-04 DIAGNOSIS — Z1211 Encounter for screening for malignant neoplasm of colon: Secondary | ICD-10-CM

## 2022-02-04 NOTE — Progress Notes (Signed)
Pls let pt know that she needs to have colonoscopy because she has positive cologuard. Referral to GI placed.

## 2022-02-04 NOTE — Progress Notes (Signed)
Pls, let pt know that her cologuard is positive. She needs to have colonoscopy.

## 2022-02-07 ENCOUNTER — Ambulatory Visit: Payer: Managed Care, Other (non HMO) | Admitting: Podiatry

## 2022-02-07 ENCOUNTER — Other Ambulatory Visit: Payer: Self-pay | Admitting: Family Medicine

## 2022-02-07 DIAGNOSIS — I10 Essential (primary) hypertension: Secondary | ICD-10-CM

## 2022-02-11 ENCOUNTER — Telehealth: Payer: Self-pay

## 2022-02-11 ENCOUNTER — Other Ambulatory Visit: Payer: Self-pay

## 2022-02-11 DIAGNOSIS — Z1211 Encounter for screening for malignant neoplasm of colon: Secondary | ICD-10-CM

## 2022-02-11 DIAGNOSIS — R195 Other fecal abnormalities: Secondary | ICD-10-CM

## 2022-02-11 MED ORDER — NA SULFATE-K SULFATE-MG SULF 17.5-3.13-1.6 GM/177ML PO SOLN: 1.0000 | Freq: Once | ORAL | 0 refills | Status: AC

## 2022-02-11 NOTE — Telephone Encounter (Signed)
Gastroenterology Pre-Procedure Review  Request Date: 10/02 Requesting Physician: Dr. Vicente Males  PATIENT REVIEW QUESTIONS: The patient responded to the following health history questions as indicated:    1. Are you having any GI issues? no referral noted positive colorectal using colorguard  no GI Issues 2. Do you have a personal history of Polyps? no 3. Do you have a family history of Colon Cancer or Polyps? no 4. Diabetes Mellitus? no 5. Joint replacements in the past 12 months?no 6. Major health problems in the past 3 months?no 7. Any artificial heart valves, MVP, or defibrillator?no    MEDICATIONS & ALLERGIES:    Patient reports the following regarding taking any anticoagulation/antiplatelet therapy:   Plavix, Coumadin, Eliquis, Xarelto, Lovenox, Pradaxa, Brilinta, or Effient? no Aspirin? no  Patient confirms/reports the following medications:  Current Outpatient Medications  Medication Sig Dispense Refill   brimonidine (ALPHAGAN P) 0.1 % SOLN      Cholecalciferol (VITAMIN D) 2000 UNITS tablet Take by mouth.     indomethacin (INDOCIN) 50 MG capsule Take 1 capsule (50 mg total) by mouth 3 (three) times daily with meals. 21 capsule 0   latanoprost (XALATAN) 0.005 % ophthalmic solution INSTILL 1 DROP INTO EACH EYE AT BEDTIME  5   lisinopril-hydrochlorothiazide (ZESTORETIC) 20-25 MG tablet Take 1 tablet by mouth daily. 30 tablet 0   MULTIPLE VITAMINS PO Take by mouth.     OMEGA 3-6-9 FATTY ACIDS PO Take by mouth.     simvastatin (ZOCOR) 20 MG tablet Take 1 tablet by mouth in the evening 90 tablet 3   sulfamethoxazole-trimethoprim (BACTRIM DS) 800-160 MG tablet Take 1 tablet by mouth 2 (two) times daily. 12 tablet 0   No current facility-administered medications for this visit.    Patient confirms/reports the following allergies:  No Known Allergies  No orders of the defined types were placed in this encounter.   AUTHORIZATION INFORMATION Primary Insurance: 1D#: Group  #:  Secondary Insurance: 1D#: Group #:  SCHEDULE INFORMATION: Date: 03/03/22 Time: Location: [facility]

## 2022-03-01 ENCOUNTER — Other Ambulatory Visit: Payer: Self-pay | Admitting: Physician Assistant

## 2022-03-01 DIAGNOSIS — I10 Essential (primary) hypertension: Secondary | ICD-10-CM

## 2022-03-03 ENCOUNTER — Ambulatory Visit: Payer: Managed Care, Other (non HMO) | Admitting: Certified Registered"

## 2022-03-03 ENCOUNTER — Encounter
Admission: RE | Disposition: A | Discharge status: Home / Self Care | Payer: Self-pay | Source: Home / Self Care | Attending: Gastroenterology

## 2022-03-03 ENCOUNTER — Ambulatory Visit
Admission: RE | Admit: 2022-03-03 | Discharge: 2022-03-03 | Disposition: A | Discharge status: Home / Self Care | Payer: Managed Care, Other (non HMO) | Attending: Gastroenterology | Admitting: Gastroenterology

## 2022-03-03 DIAGNOSIS — I10 Essential (primary) hypertension: Secondary | ICD-10-CM | POA: Diagnosis not present

## 2022-03-03 DIAGNOSIS — D126 Benign neoplasm of colon, unspecified: Secondary | ICD-10-CM

## 2022-03-03 DIAGNOSIS — Z87891 Personal history of nicotine dependence: Secondary | ICD-10-CM | POA: Diagnosis not present

## 2022-03-03 DIAGNOSIS — R195 Other fecal abnormalities: Secondary | ICD-10-CM | POA: Diagnosis present

## 2022-03-03 DIAGNOSIS — K5520 Angiodysplasia of colon without hemorrhage: Secondary | ICD-10-CM | POA: Diagnosis not present

## 2022-03-03 DIAGNOSIS — D120 Benign neoplasm of cecum: Secondary | ICD-10-CM | POA: Insufficient documentation

## 2022-03-03 DIAGNOSIS — D122 Benign neoplasm of ascending colon: Secondary | ICD-10-CM | POA: Diagnosis not present

## 2022-03-03 DIAGNOSIS — D124 Benign neoplasm of descending colon: Secondary | ICD-10-CM | POA: Insufficient documentation

## 2022-03-03 DIAGNOSIS — Z1211 Encounter for screening for malignant neoplasm of colon: Secondary | ICD-10-CM | POA: Diagnosis not present

## 2022-03-03 HISTORY — PX: COLONOSCOPY WITH PROPOFOL: SHX5780

## 2022-03-03 SURGERY — COLONOSCOPY WITH PROPOFOL
Anesthesia: General

## 2022-03-03 MED ORDER — SODIUM CHLORIDE 0.9 % IV SOLN
INTRAVENOUS | Status: DC
  Administered 2022-03-03: 20 mL/h via INTRAVENOUS

## 2022-03-03 MED ORDER — PROPOFOL 10 MG/ML IV BOLUS
INTRAVENOUS | Status: DC | PRN
  Administered 2022-03-03: 80 mg via INTRAVENOUS
  Administered 2022-03-03 (×2): 20 mg via INTRAVENOUS

## 2022-03-03 MED ORDER — FENTANYL CITRATE (PF) 100 MCG/2ML IJ SOLN
INTRAMUSCULAR | Status: DC | PRN
  Administered 2022-03-03: 100 ug via INTRAVENOUS

## 2022-03-03 MED ORDER — PROPOFOL 500 MG/50ML IV EMUL
INTRAVENOUS | Status: DC | PRN
  Administered 2022-03-03: 150 ug/kg/min via INTRAVENOUS

## 2022-03-03 MED ORDER — PROPOFOL 10 MG/ML IV BOLUS
INTRAVENOUS | Status: AC
  Filled 2022-03-03: qty 20

## 2022-03-03 NOTE — Anesthesia Procedure Notes (Signed)
Procedure Name: MAC Date/Time: 03/03/2022 12:05 PM  Performed by: Biagio Borg, CRNAPre-anesthesia Checklist: Patient identified, Emergency Drugs available, Suction available, Patient being monitored and Timeout performed Patient Re-evaluated:Patient Re-evaluated prior to induction Oxygen Delivery Method: Nasal cannula Induction Type: IV induction Placement Confirmation: positive ETCO2 and CO2 detector

## 2022-03-03 NOTE — Op Note (Signed)
[facility] Gastroenterology Patient Name: Kristen Wall Procedure Date: 03/03/2022 12:03 PM MRN: [id number] Account #: [id number] Date of Birth: May 22, 1956 Admit Type: Outpatient Age: 66 Room: [facility] ENDO ROOM 1 Gender: Female Note Status: Finalized Instrument Name: Park Meo [id number] Procedure:             Colonoscopy Indications:           Positive Cologuard test Providers:             Jonathon Bellows MD, MD Referring MD:          No Local Md, MD (Referring MD) Medicines:             Monitored Anesthesia Care Complications:         No immediate complications. Procedure:             Pre-Anesthesia Assessment:                        - Prior to the procedure, a History and Physical was                         performed, and patient medications, allergies and                         sensitivities were reviewed. The patient's tolerance                         of previous anesthesia was reviewed.                        - The risks and benefits of the procedure and the                         sedation options and risks were discussed with the                         patient. All questions were answered and informed                         consent was obtained.                        - ASA Grade Assessment: II - A patient with mild                         systemic disease.                        After obtaining informed consent, the colonoscope was                         passed under direct vision. Throughout the procedure,                         the patient's blood pressure, pulse, and oxygen                         saturations were monitored continuously. The                         Colonoscope was introduced through  the anus and                         advanced to the the cecum, identified by the                         appendiceal orifice. The colonoscopy was performed                         with ease. The patient tolerated the procedure well.                          The quality of the bowel preparation was good. Findings:      The perianal and digital rectal examinations were normal.      Two sessile polyps were found in the descending colon and ascending       colon. The polyps were 3 to 4 mm in size. These polyps were removed with       a jumbo cold forceps. Resection and retrieval were complete.      A 5 mm polyp was found in the cecum. The polyp was sessile. The polyp       was removed with a cold snare. Resection and retrieval were complete.      Two small localized angioectasias without bleeding were found in the       cecum.      The exam was otherwise without abnormality on direct and retroflexion       views. Impression:            - Two 3 to 4 mm polyps in the descending colon and in                         the ascending colon, removed with a jumbo cold                         forceps. Resected and retrieved.                        - One 5 mm polyp in the cecum, removed with a cold                         snare. Resected and retrieved.                        - Two non-bleeding colonic angioectasias.                        - The examination was otherwise normal on direct and                         retroflexion views. Recommendation:        - Discharge patient to home (with escort).                        - Resume previous diet.                        - Continue present medications.                        -  Await pathology results.                        - Repeat colonoscopy for surveillance based on                         pathology results. Procedure Code(s):     --- Professional ---                        [phone], Colonoscopy, flexible; with removal of                         tumor(s), polyp(s), or other lesion(s) by snare                         technique                        45380, 17, Colonoscopy, flexible; with biopsy, single                         or multiple Diagnosis Code(s):     --- Professional ---                         K55.20, Angiodysplasia of colon without hemorrhage                        K63.5, Polyp of colon                        R19.5, Other fecal abnormalities CPT copyright 2019 American Medical Association. All rights reserved. The codes documented in this report are preliminary and upon coder review may  be revised to meet current compliance requirements. Jonathon Bellows, MD Jonathon Bellows MD, MD 03/03/2022 12:21:28 PM This report has been signed electronically. Number of Addenda: 0 Note Initiated On: 03/03/2022 12:03 PM Scope Withdrawal Time: 0 hours 8 minutes 24 seconds  Total Procedure Duration: 0 hours 11 minutes 16 seconds  Estimated Blood Loss:  Estimated blood loss: none.      [facility]

## 2022-03-03 NOTE — Anesthesia Preprocedure Evaluation (Signed)
Anesthesia Evaluation  Patient identified by MRN, date of birth, ID band Patient awake    Reviewed: Allergy & Precautions, NPO status , Patient's Chart, lab work & pertinent test results  Airway Mallampati: III  TM Distance: >3 FB Neck ROM: Full    Dental  (+) Implants, Partial Lower   Pulmonary neg pulmonary ROS, Patient abstained from smoking., former smoker,    Pulmonary exam normal breath sounds clear to auscultation       Cardiovascular Exercise Tolerance: Good hypertension, negative cardio ROS Normal cardiovascular exam Rhythm:Regular     Neuro/Psych negative neurological ROS  negative psych ROS   GI/Hepatic negative GI ROS, Neg liver ROS,   Endo/Other  negative endocrine ROS  Renal/GU negative Renal ROS  negative genitourinary   Musculoskeletal negative musculoskeletal ROS (+)   Abdominal Normal abdominal exam  (+)   Peds negative pediatric ROS (+)  Hematology negative hematology ROS (+)   Anesthesia Other Findings No past medical history on file.  Past Surgical History: 1995: TUBAL LIGATION  BMI    Body Mass Index: 29.26 kg/m      Reproductive/Obstetrics negative OB ROS                             Anesthesia Physical Anesthesia Plan  ASA: 2  Anesthesia Plan: General   Post-op Pain Management:    Induction: Intravenous  PONV Risk Score and Plan: Propofol infusion and TIVA  Airway Management Planned: Natural Airway  Additional Equipment:   Intra-op Plan:   Post-operative Plan:   Informed Consent: I have reviewed the patients History and Physical, chart, labs and discussed the procedure including the risks, benefits and alternatives for the proposed anesthesia with the patient or authorized representative who has indicated his/her understanding and acceptance.     Dental Advisory Given  Plan Discussed with: CRNA and Surgeon  Anesthesia Plan Comments:          Anesthesia Quick Evaluation

## 2022-03-03 NOTE — Anesthesia Postprocedure Evaluation (Signed)
Anesthesia Post Note  Patient: Kristen Wall  Procedure(s) Performed: COLONOSCOPY WITH PROPOFOL  Patient location during evaluation: PACU Anesthesia Type: General Level of consciousness: awake and awake and alert Pain management: pain level controlled Vital Signs Assessment: post-procedure vital signs reviewed and stable Respiratory status: spontaneous breathing and respiratory function stable Cardiovascular status: stable Anesthetic complications: no   No notable events documented.   Last Vitals:  Vitals:   03/03/22 1235 03/03/22 1245  BP: (!) 143/73 (!) 157/63  Pulse: 82 (!) 59  Resp: 18 15  Temp:    SpO2: 100% 100%    Last Pain:  Vitals:   03/03/22 1245  TempSrc:   PainSc: 0-No pain                 VAN STAVEREN,Keirsten Matuska

## 2022-03-03 NOTE — Transfer of Care (Signed)
Immediate Anesthesia Transfer of Care Note  Patient: Alazay Veronica  Procedure(s) Performed: COLONOSCOPY WITH PROPOFOL  Patient Location: PACU and Endoscopy Unit  Anesthesia Type:General  Level of Consciousness: awake  Airway & Oxygen Therapy: Patient Spontanous Breathing  Post-op Assessment: Report given to RN and Post -op Vital signs reviewed and stable  Post vital signs: Reviewed and stable  Last Vitals:  Vitals Value Taken Time  BP 130/86 03/03/22 1225  Temp    Pulse 82 03/03/22 1225  Resp 15 03/03/22 1225  SpO2 100 % 03/03/22 1225  Vitals shown include unvalidated device data.  Last Pain:  Vitals:   03/03/22 1053  TempSrc: Temporal  PainSc: 0-No pain         Complications: No notable events documented.

## 2022-03-03 NOTE — H&P (Signed)
Jonathon Bellows, MD [address], [place], [place], [place], [address] [address], [place], [place], [address] Phone: [phone]  Fax: [phone]  Primary Care Physician:  [name], PA-C   Pre-Procedure History & Physical: HPI:  Kristen Wall is a 66 y.o. female is here for an colonoscopy.   No past medical history on file.  Past Surgical History:  Procedure Laterality Date   TUBAL LIGATION  1995    Prior to Admission medications   Medication Sig Start Date End Date Taking? Authorizing Provider  brimonidine (ALPHAGAN P) 0.1 % SOLN    Yes [provider]  Cholecalciferol (VITAMIN D) 2000 UNITS tablet Take by mouth.   Yes [provider]  indomethacin (INDOCIN) 50 MG capsule Take 1 capsule (50 mg total) by mouth 3 (three) times daily with meals. 01/19/22  Yes Rodriguez-Southworth, Sunday Spillers, PA-C  latanoprost (XALATAN) 0.005 % ophthalmic solution INSTILL 1 DROP INTO EACH EYE AT BEDTIME 11/11/17  Yes [provider]  lisinopril-hydrochlorothiazide (ZESTORETIC) 20-25 MG tablet Take 1 tablet by mouth once daily 03/03/22  Yes Ostwalt, Janna, PA-C  MULTIPLE VITAMINS PO Take by mouth.   Yes [provider]  OMEGA 3-6-9 FATTY ACIDS PO Take by mouth.   Yes [provider]  simvastatin (ZOCOR) 20 MG tablet Take 1 tablet by mouth in the evening 03/18/21  Yes Bacigalupo, Dionne Bucy, MD  sulfamethoxazole-trimethoprim (BACTRIM DS) 800-160 MG tablet Take 1 tablet by mouth 2 (two) times daily. 01/17/22  Yes Ostwalt, Letitia Libra, PA-C    Allergies as of 02/11/2022   (No Known Allergies)    Family History  Problem Relation Age of Onset   Arthritis Mother    Diabetes Mother    Hypertension Mother    Coronary artery disease Mother    Esophageal cancer Father    Heart attack Brother        Age of death: 7   Hypertension Daughter    Heart attack Maternal Grandmother    Breast cancer Cousin        3 pat cousins   Breast cancer Other  46       2 nieces    Social History   Socioeconomic History   Marital status: Married    Spouse name: [name]   Number of children: 2   Years of education: College   Highest education level: Not on file  Occupational History   Occupation: [employer]    Comment: Full-Time  Tobacco Use   Smoking status: Former    Types: Cigarettes    Quit date: 06/03/2007    Years since quitting: 14.7   Smokeless tobacco: Never  Vaping Use   Vaping Use: Never used  Substance and Sexual Activity   Alcohol use: Yes    Comment: Occasional, Drinks beer for social events.   Drug use: No   Sexual activity: Not on file  Other Topics Concern   Not on file  Social History Narrative   Not on file   Social Determinants of Health   Financial Resource Strain: Not on file  Food Insecurity: Not on file  Transportation Needs: Not on file  Physical Activity: Not on file  Stress: Not on file  Social Connections: Not on file  Intimate Partner Violence: Not on file    Review of Systems: See HPI, otherwise negative ROS  Physical Exam: BP (!) 167/87   Pulse 70   Temp 97.7 F (36.5 C) (Temporal)   Resp 20  Ht '5\' 2"'$  (1.575 m)   Wt 72.6 kg   SpO2 99%   BMI 29.26 kg/m  General:   Alert,  pleasant and cooperative in NAD Head:  Normocephalic and atraumatic. Neck:  Supple; no masses or thyromegaly. Lungs:  Clear throughout to auscultation, normal respiratory effort.    Heart:  +S1, +S2, Regular rate and rhythm, No edema. Abdomen:  Soft, nontender and nondistended. Normal bowel sounds, without guarding, and without rebound.   Neurologic:  Alert and  oriented x4;  grossly normal neurologically.  Impression/Plan: Kristen Wall is here for an colonoscopy to be performed for positive cologuard Risks, benefits, limitations, and alternatives regarding  colonoscopy have been reviewed with the patient.  Questions have been answered.  All parties agreeable.   Jonathon Bellows, MD  03/03/2022, 11:56 AM

## 2022-03-03 NOTE — Telephone Encounter (Signed)
Requested Prescriptions  Pending Prescriptions Disp Refills  . lisinopril-hydrochlorothiazide (ZESTORETIC) 20-25 MG tablet [Pharmacy Med Name: Lisinopril-hydroCHLOROthiazide 20-25 MG Oral Tablet] 90 tablet 1    Sig: Take 1 tablet by mouth once daily     Cardiovascular:  ACEI + Diuretic Combos Passed - 03/01/2022 10:06 AM      Passed - Na in normal range and within 180 days    Sodium  Date Value Ref Range Status  12/30/2021 141 134 - 144 mmol/L Final         Passed - K in normal range and within 180 days    Potassium  Date Value Ref Range Status  12/30/2021 4.9 3.5 - 5.2 mmol/L Final         Passed - Cr in normal range and within 180 days    Creatinine, Ser  Date Value Ref Range Status  12/30/2021 0.60 0.57 - 1.00 mg/dL Final         Passed - eGFR is 30 or above and within 180 days    GFR calc Af Amer  Date Value Ref Range Status  12/12/2019 114 >59 mL/min/1.73 Final    Comment:    **[facility] currently reports eGFR in compliance with the current**   recommendations of the [employer]. [facility] will   update reporting as new guidelines are published from the NKF-ASN   Task force.    GFR calc non Af Amer  Date Value Ref Range Status  12/12/2019 99 >59 mL/min/1.73 Final   eGFR  Date Value Ref Range Status  12/30/2021 100 >59 mL/min/1.73 Final         Passed - Patient is not pregnant      Passed - Last BP in normal range    BP Readings from Last 1 Encounters:  01/19/22 132/70         Passed - Valid encounter within last 6 months    Recent Outpatient Visits          1 month ago Foot pain, right   [facility] [place], [place], PA-C   2 months ago Essential hypertension   [employer], [place], PA-C   8 months ago Essential hypertension   [employer], Dionne Bucy, MD   1 year ago Routine general medical examination at a health care facility   [facility], Dionne Bucy, MD    2 years ago Routine general medical examination at a health care facility   [facility], Clearnce Sorrel, [place]      Future Appointments            In 3 months Amalia Hailey, Dorathy Daft, [place] and [place] at [place], [facility]

## 2022-03-04 ENCOUNTER — Encounter: Payer: Self-pay | Admitting: Gastroenterology

## 2022-03-04 LAB — SURGICAL PATHOLOGY

## 2022-03-13 ENCOUNTER — Encounter: Payer: Self-pay | Admitting: Gastroenterology

## 2022-03-27 ENCOUNTER — Ambulatory Visit
Admission: RE | Admit: 2022-03-27 | Discharge: 2022-03-27 | Disposition: A | Discharge status: Home / Self Care | Payer: Managed Care, Other (non HMO) | Source: Ambulatory Visit | Attending: Physician Assistant | Admitting: Physician Assistant

## 2022-03-27 DIAGNOSIS — Z1231 Encounter for screening mammogram for malignant neoplasm of breast: Secondary | ICD-10-CM | POA: Insufficient documentation

## 2022-03-27 DIAGNOSIS — E2839 Other primary ovarian failure: Secondary | ICD-10-CM | POA: Diagnosis present

## 2022-03-28 NOTE — Progress Notes (Signed)
[place] ,   Your imaging results are back. Per WHO criteria you are considered to be osteopenic. Recommended to take Calcium and Vitamin D daily. You might repeat Dexa in 2 years.  Any questions please reach out to the office or message me on MyChart!  Best, [name], PA-C

## 2022-04-01 ENCOUNTER — Telehealth: Payer: Self-pay

## 2022-04-01 NOTE — Telephone Encounter (Signed)
-----   Message from [name], [place] sent at 03/28/2022  1:51 PM EDT ----- Kristen Wall ,   Your imaging results are back. Per WHO criteria you are considered to be osteopenic. Recommended to take Calcium and Vitamin D daily. You might repeat Dexa in 2 years.  Any questions please reach out to the office or message me on MyChart!  Best, [name], PA-C

## 2022-04-01 NOTE — Telephone Encounter (Signed)
Patient advised.

## 2022-04-01 NOTE — Telephone Encounter (Signed)
Randal Buba, CMA  03/31/2022 11:45 AM EDT Back to Top    Patient advised. Please advise on what dose of calcium and Vitamin D she needs to take. Patient also request the results of her mammogram. Please review and advise.         [name], PA-C  03/28/2022  1:51 PM EDT     Hello Kristen Wall ,    Your imaging results are back. Per WHO criteria you are considered to be osteopenic. Recommended to take Calcium and Vitamin D daily. You might repeat Dexa in 2 years.   Any questions please reach out to the office or message me on MyChart!   Best, [name], PA-C

## 2022-04-15 ENCOUNTER — Other Ambulatory Visit: Payer: Self-pay | Admitting: Family Medicine

## 2022-04-15 DIAGNOSIS — E7800 Pure hypercholesterolemia, unspecified: Secondary | ICD-10-CM

## 2022-04-15 NOTE — Telephone Encounter (Signed)
Requested Prescriptions  Pending Prescriptions Disp Refills   simvastatin (ZOCOR) 20 MG tablet [Pharmacy Med Name: Simvastatin 20 MG Oral Tablet] 90 tablet 2    Sig: Take 1 tablet by mouth in the evening     Cardiovascular:  Antilipid - Statins Failed - 04/15/2022  4:15 PM      Failed - Lipid Panel in normal range within the last 12 months    Cholesterol, Total  Date Value Ref Range Status  12/30/2021 206 (H) 100 - 199 mg/dL Final   LDL Chol Calc (NIH)  Date Value Ref Range Status  12/30/2021 104 (H) 0 - 99 mg/dL Final   HDL  Date Value Ref Range Status  12/30/2021 73 >39 mg/dL Final   Triglycerides  Date Value Ref Range Status  12/30/2021 170 (H) 0 - 149 mg/dL Final         Passed - Patient is not pregnant      Passed - Valid encounter within last 12 months    Recent Outpatient Visits           2 months ago Foot pain, right   [employer], [place], PA-C   3 months ago Essential hypertension   [employer], [place], PA-C   10 months ago Essential hypertension   [employer], Dionne Bucy, MD   1 year ago Routine general medical examination at a health care facility   [facility], Dionne Bucy, MD   2 years ago Routine general medical examination at a health care facility   [facility], Clearnce Sorrel, [place]       Future Appointments             In 1 month Amalia Hailey, Dorathy Daft, [place] and [place] at [place], [facility]

## 2022-06-10 ENCOUNTER — Ambulatory Visit: Payer: Managed Care, Other (non HMO) | Admitting: Podiatry

## 2022-06-10 VITALS — BP 117/69 | HR 68

## 2022-06-10 DIAGNOSIS — M109 Gout, unspecified: Secondary | ICD-10-CM | POA: Diagnosis not present

## 2022-06-10 NOTE — Progress Notes (Signed)
   Chief Complaint  Patient presents with   Foot Pain    Right foot pain and swelling top of the foot  gout, X-Rays taken today     HPI: 67 y.o. female presenting today as a new patient for evaluation of previously diagnosed gout to the left foot.  Patient states that in August 2023 she sustained redness with swelling and acute pain to the left foot despite denying history of injury.  She was diagnosed with gout at that time.  She says that since then the pain has completely resolved but she continues to have a soft tissue 'bump' to the dorsum of the left foot.  She presents for further treatment and evaluation  No past medical history on file.  Past Surgical History:  Procedure Laterality Date   COLONOSCOPY WITH PROPOFOL N/A 03/03/2022   Procedure: COLONOSCOPY WITH PROPOFOL;  Surgeon: Jonathon Bellows, MD;  Location: [facility];  Service: Gastroenterology;  Laterality: N/A;   TUBAL LIGATION  1995    No Known Allergies   Physical Exam: General: The patient is alert and oriented x3 in no acute distress.  Dermatology: Skin is warm, dry and supple bilateral lower extremities. Negative for open lesions or macerations.  Vascular: Palpable pedal pulses bilaterally. Capillary refill within normal limits.  Negative for any significant edema or erythema  Neurological: Light touch and protective threshold grossly intact  Musculoskeletal Exam: No pedal deformities noted  Radiographic Exam LT foot 06/10/2022:  Normal osseous mineralization. Joint spaces preserved.  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  Os peroneum noted.  No acute fractures identified  Assessment: 1.  Previously diagnosed acute gout left foot; currently asymptomatic   Plan of Care:  1. Patient evaluated. X-Rays reviewed.  2.  Explained to the patient that the soft tissue prominence to the dorsum of the left foot is not worrisome since it is  completely asymptomatic.  Likely soft tissue muscle belly 3.  Recommend good supportive shoes and sneakers 4.  Recommend educating on dietary restrictions involving gout 5.  Return to clinic as needed      Edrick Kins, DPM [facility]  Dr. Edrick Kins, DPM    [address], [place] [address]                Office [phone]  Fax [phone]

## 2022-06-23 ENCOUNTER — Telehealth: Payer: Self-pay

## 2022-06-23 NOTE — Telephone Encounter (Signed)
Copied from [place] [phone]. Topic: Appointment Scheduling - Scheduling Inquiry for Clinic >> Jun 23, 2022  2:56 PM Rosanne Ashing P wrote: Reason for CRM: pt called to schedule a cpe in August with Dr. Jacinto Reap.  She is new to medicare and the system would not let me schedule.  [phone]

## 2022-06-23 NOTE — Telephone Encounter (Signed)
Pt scheduled  

## 2022-07-18 DIAGNOSIS — H401132 Primary open-angle glaucoma, bilateral, moderate stage: Secondary | ICD-10-CM | POA: Diagnosis not present

## 2022-07-31 ENCOUNTER — Other Ambulatory Visit: Payer: Self-pay | Admitting: Physician Assistant

## 2022-07-31 DIAGNOSIS — I10 Essential (primary) hypertension: Secondary | ICD-10-CM

## 2022-07-31 NOTE — Telephone Encounter (Signed)
Received a fax from [place] stating that insurance is requesting a 90 or 100 day supply of this medication.

## 2022-07-31 NOTE — Telephone Encounter (Signed)
LOV 01/17/22 NOV 01/05/23 LRF 03/03/22 #90 1R (6 month supply)

## 2022-08-06 ENCOUNTER — Telehealth: Payer: Self-pay | Admitting: Physician Assistant

## 2022-08-06 NOTE — Telephone Encounter (Signed)
[place] requesting prescription refill lisinopril-hydrochlorothiazide (ZESTORETIC) 20-25 MG tablet  Please advise

## 2022-08-07 ENCOUNTER — Other Ambulatory Visit: Payer: Self-pay

## 2022-08-07 DIAGNOSIS — I10 Essential (primary) hypertension: Secondary | ICD-10-CM

## 2022-08-20 ENCOUNTER — Other Ambulatory Visit: Payer: Self-pay | Admitting: Physician Assistant

## 2022-08-20 ENCOUNTER — Telehealth: Payer: Self-pay | Admitting: Physician Assistant

## 2022-08-20 DIAGNOSIS — I10 Essential (primary) hypertension: Secondary | ICD-10-CM

## 2022-08-20 NOTE — Telephone Encounter (Signed)
Appt scheduled tomorrow w/ Cheryll Cockayne, [place]

## 2022-08-20 NOTE — Telephone Encounter (Signed)
Pt scheduled f/u hypertension appt tomorrow w/ Cheryll Cockayne, [place]

## 2022-08-21 ENCOUNTER — Ambulatory Visit (INDEPENDENT_AMBULATORY_CARE_PROVIDER_SITE_OTHER): Payer: Medicare Other | Admitting: Physician Assistant

## 2022-08-21 ENCOUNTER — Encounter: Payer: Self-pay | Admitting: Physician Assistant

## 2022-08-21 VITALS — BP 107/58 | HR 64 | Ht 62.0 in | Wt 163.0 lb

## 2022-08-21 DIAGNOSIS — R7303 Prediabetes: Secondary | ICD-10-CM | POA: Diagnosis not present

## 2022-08-21 DIAGNOSIS — J029 Acute pharyngitis, unspecified: Secondary | ICD-10-CM | POA: Diagnosis not present

## 2022-08-21 DIAGNOSIS — E7800 Pure hypercholesterolemia, unspecified: Secondary | ICD-10-CM

## 2022-08-21 DIAGNOSIS — I10 Essential (primary) hypertension: Secondary | ICD-10-CM | POA: Diagnosis not present

## 2022-08-21 LAB — POCT RAPID STREP A (OFFICE): Rapid Strep A Screen: NEGATIVE

## 2022-08-21 MED ORDER — LISINOPRIL-HYDROCHLOROTHIAZIDE 20-25 MG PO TABS: 1.0000 | ORAL_TABLET | Freq: Every day | ORAL | 0 refills | Status: DC

## 2022-08-21 NOTE — Progress Notes (Signed)
Established patient visit   Patient: Kristen Wall   DOB: 10/31/1955   67 y.o. Female  MRN: [id number] Visit Date: 08/21/2022  Today's healthcare provider: [name], PA-C   CC: HTN/prediabetes/hld/overweight FU  Subjective     HPI   Blood pressure f/u Last edited by Elta Guadeloupe, CMA on 08/21/2022  9:56 AM.      Hypertension, follow-up  BP Readings from Last 3 Encounters:  08/21/22 (!) 107/58  06/10/22 117/69  03/03/22 (!) 157/63   Wt Readings from Last 3 Encounters:  08/21/22 163 lb (73.9 kg)  03/03/22 160 lb (72.6 kg)  01/19/22 158 lb (71.7 kg)     She was last seen for hypertension 7 months ago.  BP at that visit was WNL. Management since that visit includes Zestoretic.  She reports good compliance with treatment. She is not having side effects.  She is following a Low Sodium diet. She is exercising. She does not smoke. Former smoker.  Symptoms: No chest pain No chest pressure  No palpitations No syncope  No dyspnea No orthopnea  No paroxysmal nocturnal dyspnea No lower extremity edema   Pertinent labs Lab Results  Component Value Date   CHOL 206 (H) 12/30/2021   HDL 73 12/30/2021   LDLCALC 104 (H) 12/30/2021   TRIG 170 (H) 12/30/2021   CHOLHDL 2.8 12/30/2021   Lab Results  Component Value Date   NA 141 12/30/2021   K 4.9 12/30/2021   CREATININE 0.60 12/30/2021   EGFR 100 12/30/2021   GLUCOSE 91 12/30/2021   TSH 0.753 12/12/2019     The ASCVD Risk score (Arnett DK, et al., 2019) failed to calculate for the following reasons:   Unable to determine if patient is Non-Hispanic African American  ----------------------------------------------------------------------------------------Lipid/Cholesterol, Follow-up  Last lipid panel Other pertinent labs  Lab Results  Component Value Date   CHOL 206 (H) 12/30/2021   HDL 73 12/30/2021   LDLCALC 104 (H) 12/30/2021   TRIG 170 (H) 12/30/2021   CHOLHDL 2.8 12/30/2021   Lab Results  Component  Value Date   ALT 31 01/08/2021   AST 23 01/08/2021   PLT 391 01/17/2022   TSH 0.753 12/12/2019     She was last seen for this 8 months ago.  Management since that visit includes simvastatin 20 mg  She reports fair compliance with treatment. She is not having side effects.    The ASCVD Risk score (Arnett DK, et al., 2019) failed to calculate for the following reasons:   Unable to determine if patient is Non-Hispanic African American  -------------------------------------------------------------------------------------------------  Medications: Outpatient Medications Prior to Visit  Medication Sig   brimonidine (ALPHAGAN P) 0.1 % SOLN    Cholecalciferol (VITAMIN D) 2000 UNITS tablet Take by mouth.   latanoprost (XALATAN) 0.005 % ophthalmic solution INSTILL 1 DROP INTO EACH EYE AT BEDTIME   MULTIPLE VITAMINS PO Take by mouth.   OMEGA 3-6-9 FATTY ACIDS PO Take by mouth.   simvastatin (ZOCOR) 20 MG tablet Take 1 tablet by mouth in the evening   [DISCONTINUED] lisinopril-hydrochlorothiazide (ZESTORETIC) 20-25 MG tablet Take 1 tablet by mouth once daily   No facility-administered medications prior to visit.    Review of Systems  All other systems reviewed and are negative. Except see HPI      Objective    BP (!) 107/58 (BP Location: Right Arm, Patient Position: Sitting, Cuff Size: Normal)   Pulse 64   Ht 5\' 2"  (1.575 m)   Wt  163 lb (73.9 kg)   SpO2 98%   BMI 29.81 kg/m    Physical Exam Vitals reviewed.  Constitutional:      General: She is not in acute distress.    Appearance: Normal appearance. She is well-developed. She is not diaphoretic.  HENT:     Head: Normocephalic and atraumatic.     Nose: Nose normal.  Eyes:     General: No scleral icterus.       Right eye: No discharge.        Left eye: No discharge.     Extraocular Movements: Extraocular movements intact.     Conjunctiva/sclera: Conjunctivae normal.     Pupils: Pupils are equal, round, and reactive  to light.  Neck:     Thyroid: No thyromegaly.  Cardiovascular:     Rate and Rhythm: Normal rate and regular rhythm.     Pulses: Normal pulses.     Heart sounds: Normal heart sounds. No murmur heard. Pulmonary:     Effort: Pulmonary effort is normal. No respiratory distress.     Breath sounds: Normal breath sounds. No wheezing, rhonchi or rales.  Musculoskeletal:     Cervical back: Neck supple.     Right lower leg: No edema.     Left lower leg: No edema.  Lymphadenopathy:     Cervical: No cervical adenopathy.  Skin:    General: Skin is warm and dry.     Findings: No rash.  Neurological:     Mental Status: She is alert and oriented to person, place, and time. Mental status is at baseline.  Psychiatric:        Behavior: Behavior normal.        Thought Content: Thought content normal.        Judgment: Judgment normal.     No results found for any visits on 08/21/22.  Assessment & Plan     1. Essential hypertension Chronic and Controlled Continue dietary sodium restrictions Continue BP log, her BP today was 107/58 Contact us in 2 weeks  Will adjust med dose depending on the results of home monitoring Continue zerstoretic 20-25mg  daily Ordered labs as last blood work was done in 12/2021  2. Prediabetes  Chronic, previously controlled A1C from 7 mo ago 5.8 Continue to recommend balanced, lower carb meals. Smaller meal size, adding snacks. Choosing water as drink of choice and increasing purposeful exercise.   Will reassess after receiving the lab results   3. Hypercholesteremia   Chronic Repeat LP Goal LDL <70 Previously on simvastatin 20 mg recommend diet low in saturated fat and regular exercise - 30 min at least 5 times per week    The ASCVD Risk score (Arnett DK, et al., 2019) failed to calculate for the following reasons:   Unable to determine if patient is Non-Hispanic African American    Return in about 1 year (around 08/21/2023) for chronic disease f/u.     CPE was scheduled with Dr. Jacinto Reap in August, 2024  The patient was advised to call back or seek an in-person evaluation if the symptoms worsen or if the condition fails to improve as anticipated.  I discussed the assessment and treatment plan with the patient. The patient was provided an opportunity to ask questions and all were answered. The patient agreed with the plan and demonstrated an understanding of the instructions.  I, [name], PA-C have reviewed all documentation for this visit. The documentation on  08/21/22 for the exam, diagnosis, procedures, and orders are  all accurate and complete.  [name], [facility], [place] [phone] (phone) [phone] (fax)   [place]

## 2022-08-21 NOTE — Addendum Note (Signed)
Addended by: Elta Guadeloupe on: 08/21/2022 04:20 PM   Modules accepted: Orders

## 2022-08-22 LAB — CBC WITH DIFFERENTIAL/PLATELET
Basophils Absolute: 0 10*3/uL (ref 0.0–0.2)
Basos: 0 %
EOS (ABSOLUTE): 0.1 10*3/uL (ref 0.0–0.4)
Eos: 1 %
Hematocrit: 38.6 % (ref 34.0–46.6)
Hemoglobin: 13.1 g/dL (ref 11.1–15.9)
Immature Grans (Abs): 0 10*3/uL (ref 0.0–0.1)
Immature Granulocytes: 0 %
Lymphocytes Absolute: 1.6 10*3/uL (ref 0.7–3.1)
Lymphs: 25 %
MCH: 32 pg (ref 26.6–33.0)
MCHC: 33.9 g/dL (ref 31.5–35.7)
MCV: 94 fL (ref 79–97)
Monocytes Absolute: 0.7 10*3/uL (ref 0.1–0.9)
Monocytes: 11 %
Neutrophils Absolute: 4 10*3/uL (ref 1.4–7.0)
Neutrophils: 63 %
Platelets: 281 10*3/uL (ref 150–450)
RBC: 4.1 x10E6/uL (ref 3.77–5.28)
RDW: 12.5 % (ref 11.7–15.4)
WBC: 6.4 10*3/uL (ref 3.4–10.8)

## 2022-08-22 LAB — COMPREHENSIVE METABOLIC PANEL
ALT: 32 IU/L (ref 0–32)
AST: 27 IU/L (ref 0–40)
Albumin/Globulin Ratio: 1.8 (ref 1.2–2.2)
Albumin: 4.4 g/dL (ref 3.9–4.9)
Alkaline Phosphatase: 69 IU/L (ref 44–121)
BUN/Creatinine Ratio: 16 (ref 12–28)
BUN: 8 mg/dL (ref 8–27)
Bilirubin Total: 0.4 mg/dL (ref 0.0–1.2)
CO2: 25 mmol/L (ref 20–29)
Calcium: 9.9 mg/dL (ref 8.7–10.3)
Chloride: 101 mmol/L (ref 96–106)
Creatinine, Ser: 0.5 mg/dL — ABNORMAL LOW (ref 0.57–1.00)
Globulin, Total: 2.4 g/dL (ref 1.5–4.5)
Glucose: 107 mg/dL — ABNORMAL HIGH (ref 70–99)
Potassium: 4.5 mmol/L (ref 3.5–5.2)
Sodium: 141 mmol/L (ref 134–144)
Total Protein: 6.8 g/dL (ref 6.0–8.5)
eGFR: 104 mL/min/{1.73_m2} (ref >59)

## 2022-08-22 LAB — LIPID PANEL
Chol/HDL Ratio: 3.3 ratio (ref 0.0–4.4)
Cholesterol, Total: 206 mg/dL — ABNORMAL HIGH (ref 100–199)
HDL: 63 mg/dL (ref >39)
LDL Chol Calc (NIH): 105 mg/dL — ABNORMAL HIGH (ref 0–99)
Triglycerides: 226 mg/dL — ABNORMAL HIGH (ref 0–149)
VLDL Cholesterol Cal: 38 mg/dL (ref 5–40)

## 2022-08-26 NOTE — Progress Notes (Signed)
Please, let pt know that her lab results are normal except elevated cholesterol.Advised to continue taking statin daily/simvastatin and adhere to low cholesterol diet and regular exercise.

## 2022-12-06 ENCOUNTER — Other Ambulatory Visit: Payer: Self-pay | Admitting: Physician Assistant

## 2022-12-06 DIAGNOSIS — I10 Essential (primary) hypertension: Secondary | ICD-10-CM

## 2022-12-06 DIAGNOSIS — E7800 Pure hypercholesterolemia, unspecified: Secondary | ICD-10-CM

## 2022-12-08 NOTE — Telephone Encounter (Signed)
Requested Prescriptions  Pending Prescriptions Disp Refills   lisinopril-hydrochlorothiazide (ZESTORETIC) 20-25 MG tablet [Pharmacy Med Name: Lisinopril-hydroCHLOROthiazide 20-25 MG Oral Tablet] 90 tablet 0    Sig: Take 1 tablet by mouth once daily     Cardiovascular:  ACEI + Diuretic Combos Failed - 12/06/2022  1:36 PM      Failed - Cr in normal range and within 180 days    Creatinine, Ser  Date Value Ref Range Status  08/21/2022 0.50 (L) 0.57 - 1.00 mg/dL Final         Passed - Na in normal range and within 180 days    Sodium  Date Value Ref Range Status  08/21/2022 141 134 - 144 mmol/L Final         Passed - K in normal range and within 180 days    Potassium  Date Value Ref Range Status  08/21/2022 4.5 3.5 - 5.2 mmol/L Final         Passed - eGFR is 30 or above and within 180 days    GFR calc Af Amer  Date Value Ref Range Status  12/12/2019 114 >59 mL/min/1.73 Final    Comment:    **[facility] currently reports eGFR in compliance with the current**   recommendations of the [employer]. [facility] will   update reporting as new guidelines are published from the NKF-ASN   Task force.    GFR calc non Af Amer  Date Value Ref Range Status  12/12/2019 99 >59 mL/min/1.73 Final   eGFR  Date Value Ref Range Status  08/21/2022 104 >59 mL/min/1.73 Final         Passed - Patient is not pregnant      Passed - Last BP in normal range    BP Readings from Last 1 Encounters:  08/21/22 (!) 107/58         Passed - Valid encounter within last 6 months    Recent Outpatient Visits           3 months ago Prediabetes   [place] [facility] [place], [place], PA-C   10 months ago Foot pain, right   [place] [facility] [place], [place], PA-C   11 months ago Essential hypertension   [facility] [place], [place], PA-C   1 year ago Essential hypertension   [place] [facility] [place], Marzella Schlein,  MD   1 year ago Routine general medical examination at a health care facility   [facility] [place], Marzella Schlein, MD               simvastatin (ZOCOR) 20 MG tablet [Pharmacy Med Name: Simvastatin 20 MG Oral Tablet] 90 tablet 1    Sig: Take 1 tablet by mouth in the evening     Cardiovascular:  Antilipid - Statins Failed - 12/06/2022  1:36 PM      Failed - Lipid Panel in normal range within the last 12 months    Cholesterol, Total  Date Value Ref Range Status  08/21/2022 206 (H) 100 - 199 mg/dL Final   LDL Chol Calc (NIH)  Date Value Ref Range Status  08/21/2022 105 (H) 0 - 99 mg/dL Final   HDL  Date Value Ref Range Status  08/21/2022 63 >39 mg/dL Final   Triglycerides  Date Value Ref Range Status  08/21/2022 226 (H) 0 - 149 mg/dL Final         Passed - Patient is not pregnant  Passed - Valid encounter within last 12 months    Recent Outpatient Visits           3 months ago Prediabetes   [facility] [place], [place], PA-C   10 months ago Foot pain, right   [place] [facility] [place], [place], [place]   11 months ago Essential hypertension   [place] [facility] [place], [place], PA-C   1 year ago Essential hypertension   [place] [facility] [place], Marzella Schlein, MD   1 year ago Routine general medical examination at a health care facility   [facility], Marzella Schlein, MD

## 2023-01-05 ENCOUNTER — Ambulatory Visit (INDEPENDENT_AMBULATORY_CARE_PROVIDER_SITE_OTHER): Payer: Medicare Other | Admitting: Family Medicine

## 2023-01-05 ENCOUNTER — Encounter: Payer: Self-pay | Admitting: Family Medicine

## 2023-01-05 VITALS — BP 128/63 | HR 63 | Temp 97.7°F | Resp 12 | Ht 62.0 in | Wt 153.6 lb

## 2023-01-05 DIAGNOSIS — M8580 Other specified disorders of bone density and structure, unspecified site: Secondary | ICD-10-CM | POA: Diagnosis not present

## 2023-01-05 DIAGNOSIS — Z1231 Encounter for screening mammogram for malignant neoplasm of breast: Secondary | ICD-10-CM | POA: Diagnosis not present

## 2023-01-05 DIAGNOSIS — Z0000 Encounter for general adult medical examination without abnormal findings: Secondary | ICD-10-CM

## 2023-01-05 NOTE — Progress Notes (Signed)
Medicare Initial Preventative Physical Exam    Patient: Kristen Wall, Female    DOB: 11/15/1955, 67 y.o.   MRN: [id number] Visit Date: 01/05/2023  Today's Provider: Shirlee Latch, MD   Chief Complaint  Patient presents with   Medicare Wellness   Subjective    Medicare Initial Preventative Physical Exam Kristen Wall is a 67 y.o. female who presents today for her Initial Preventative Physical Exam.   HPI  Discussed the use of AI scribe software for clinical note transcription with the patient, who gave verbal consent to proceed.  History of Present Illness   The patient, new to Medicare within the last year, presents for a wellness visit. The patient's mammogram from October of the previous year was noted, and a new one was ordered for the upcoming October. The patient had a positive Cologuard test last year, but a subsequent colonoscopy found polyp - next c-scope in 7 yrs. The patient is up to date on vaccines, including pneumonia and shingles, and will be due for a flu shot and a COVID booster next month. The patient sees an eye doctor regularly and reports no hearing problems. The patient's EKG was normal. The patient is retired and sometimes finds it boring. The patient's bone density test from the previous year showed osteopenia, and a recheck is due in five years.       Social History   Socioeconomic History   Marital status: Married    Spouse name: [name]   Number of children: 2   Years of education: College   Highest education level: Not on file  Occupational History   Occupation: [employer]    Comment: Full-Time  Tobacco Use   Smoking status: Former    Current packs/day: 0.00    Types: Cigarettes    Quit date: 06/03/2007    Years since quitting: 15.6   Smokeless tobacco: Never  Vaping Use   Vaping status: Never Used  Substance and Sexual Activity   Alcohol use: Yes    Comment: Occasional, Drinks beer for social events.   Drug use: No   Sexual  activity: Not on file  Other Topics Concern   Not on file  Social History Narrative   Not on file   Social Determinants of Health   Financial Resource Strain: Not on file  Food Insecurity: Not on file  Transportation Needs: Not on file  Physical Activity: Not on file  Stress: Not on file  Social Connections: Not on file  Intimate Partner Violence: Not on file    History reviewed. No pertinent past medical history.   Patient Active Problem List   Diagnosis Date Noted   Osteopenia 01/05/2023   Adenomatous polyp of colon    Prediabetes 06/17/2021   Allergic rhinitis 10/03/2014   Carpal tunnel syndrome on right 10/03/2014   Closed fracture of foot 10/03/2014   H/O: glaucoma 10/03/2014   Burning or prickling sensation 10/03/2014   Essential hypertension 02/28/2003   Hypercholesteremia 07/30/2000   History of tobacco use 11/08/1998    Past Surgical History:  Procedure Laterality Date   COLONOSCOPY WITH PROPOFOL N/A 03/03/2022   Procedure: COLONOSCOPY WITH PROPOFOL;  Surgeon: Wyline Mood, MD;  Location: [facility] ENDOSCOPY;  Service: Gastroenterology;  Laterality: N/A;   TUBAL LIGATION  1995    Her family history includes Arthritis in her mother; Breast cancer in her cousin; Breast cancer (age of onset: 40) in an other family member; Coronary artery disease in her mother; Diabetes in her mother; Esophageal  cancer in her father; Heart attack in her brother and maternal grandmother; Hypertension in her daughter and mother.   Current Outpatient Medications:    brimonidine (ALPHAGAN P) 0.1 % SOLN, , Disp: , Rfl:    Cholecalciferol (VITAMIN D) 2000 UNITS tablet, Take by mouth., Disp: , Rfl:    latanoprost (XALATAN) 0.005 % ophthalmic solution, INSTILL 1 DROP INTO EACH EYE AT BEDTIME, Disp: , Rfl: 5   lisinopril-hydrochlorothiazide (ZESTORETIC) 20-25 MG tablet, Take 1 tablet by mouth once daily, Disp: 90 tablet, Rfl: 0   MULTIPLE VITAMINS PO, Take by mouth., Disp: , Rfl:    OMEGA 3-6-9  FATTY ACIDS PO, Take by mouth., Disp: , Rfl:    simvastatin (ZOCOR) 20 MG tablet, Take 1 tablet by mouth in the evening, Disp: 90 tablet, Rfl: 2   Patient Care Team: Erasmo Downer, MD as PCP - General (Family Medicine)  Review of Systems per HPI      Objective    Vitals: BP 128/63 (BP Location: Left Arm, Patient Position: Sitting, Cuff Size: Large)   Pulse 63   Temp 97.7 F (36.5 C) (Temporal)   Resp 12   Ht 5\' 2"  (1.575 m)   Wt 153 lb 9.6 oz (69.7 kg)   SpO2 97%   BMI 28.09 kg/m  Vision Screening   Right eye Left eye Both eyes  Without correction     With correction 20/25 20/25 20/25    Physical Exam Vitals reviewed.  Constitutional:      General: She is not in acute distress.    Appearance: Normal appearance. She is well-developed. She is not diaphoretic.  HENT:     Head: Normocephalic and atraumatic.     Right Ear: Tympanic membrane, ear canal and external ear normal.     Left Ear: Tympanic membrane, ear canal and external ear normal.     Nose: Nose normal.     Mouth/Throat:     Mouth: Mucous membranes are moist.     Pharynx: Oropharynx is clear. No oropharyngeal exudate.  Eyes:     General: No scleral icterus.    Conjunctiva/sclera: Conjunctivae normal.     Pupils: Pupils are equal, round, and reactive to light.  Neck:     Thyroid: No thyromegaly.  Cardiovascular:     Rate and Rhythm: Normal rate and regular rhythm.     Heart sounds: Normal heart sounds. No murmur heard. Pulmonary:     Effort: Pulmonary effort is normal. No respiratory distress.     Breath sounds: Normal breath sounds. No wheezing or rales.  Abdominal:     General: There is no distension.     Palpations: Abdomen is soft.     Tenderness: There is no abdominal tenderness.  Musculoskeletal:        General: No deformity.     Cervical back: Neck supple.     Right lower leg: No edema.     Left lower leg: No edema.  Lymphadenopathy:     Cervical: No cervical adenopathy.  Skin:     General: Skin is warm and dry.     Findings: No rash.  Neurological:     Mental Status: She is alert and oriented to person, place, and time. Mental status is at baseline.     Gait: Gait normal.  Psychiatric:        Mood and Affect: Mood normal.        Behavior: Behavior normal.        Thought Content: Thought content normal.  EKG NSR  Activities of Daily Living    08/21/2022   10:56 AM  In your present state of health, do you have any difficulty performing the following activities:  Hearing? 0  Vision? 1  Difficulty concentrating or making decisions? 0  Walking or climbing stairs? 0  Dressing or bathing? 0  Doing errands, shopping? 0    Fall Risk Assessment    01/05/2023    8:53 AM 08/21/2022   10:55 AM 12/30/2021    1:54 PM 12/13/2020    2:13 PM 12/12/2019    2:18 PM  Fall Risk   Falls in the past year? 0 0 0 0 0  Number falls in past yr: 0 0 0  0  Injury with Fall? 0 0 0 0 0  Risk for fall due to : No Fall Risks   No Fall Risks No Fall Risks  Follow up Falls evaluation completed  Falls evaluation completed Falls evaluation completed Falls evaluation completed     Depression Screen    01/05/2023    8:53 AM 08/21/2022   10:56 AM 08/21/2022   10:55 AM 12/30/2021    1:54 PM  PHQ 2/9 Scores  PHQ - 2 Score 0 0 0 0  PHQ- 9 Score  0 0 0        No data to display          No results found for any visits on 01/05/23.  Assessment & Plan      Initial Preventative Physical Exam  Reviewed patient's Family Medical History Reviewed and updated list of patient's medical providers Assessment of cognitive impairment was done Assessed patient's functional ability Established a written schedule for health screening services Health Risk Assessent Completed and Reviewed  Exercise Activities and Dietary recommendations  Goals      Exercise 150 minutes per week (moderate activity)     Reduce portion size     Decrease carbohydrates.         Immunization History   Administered Date(s) Administered   Influenza, High Dose Seasonal PF 03/03/2017   Influenza,inj,Quad PF,6+ Mos 06/17/2021   Moderna Sars-Covid-2 Vaccination 10/15/2019, 11/12/2019   PNEUMOCOCCAL CONJUGATE-20 12/30/2021   Td 06/23/1995, 12/02/2017   Zoster Recombinant(Shingrix) 12/13/2020, 06/17/2021    Health Maintenance  Topic Date Due   COVID-19 Vaccine (3 - 2023-24 season) 01/31/2022   INFLUENZA VACCINE  08/31/2023 (Originally 01/01/2023)   Medicare Annual Wellness (AWV)  01/05/2024   MAMMOGRAM  03/27/2024   DEXA SCAN  03/28/2027   DTaP/Tdap/Td (3 - Tdap) 12/03/2027   Colonoscopy  03/03/2029   Pneumonia Vaccine 73+ Years old  Completed   Hepatitis C Screening  Completed   Zoster Vaccines- Shingrix  Completed   HPV VACCINES  Aged Out   Fecal DNA (Cologuard)  Discontinued     Discussed health benefits of physical activity, and encouraged her to engage in regular exercise appropriate for her age and condition.   Problem List Items Addressed This Visit       Musculoskeletal and Integument   Osteopenia   Other Visit Diagnoses     Welcome to Medicare preventive visit    -  Primary   Relevant Orders   EKG 12-Lead   Breast cancer screening by mammogram       Relevant Orders   MM 3D SCREENING MAMMOGRAM BILATERAL BREAST          Breast Cancer Screening Last mammogram in October 2023, no abnormalities reported. -Order mammogram for October 2024 at [facility].  Colon Cancer Screening Positive Cologuard test in 2023, followed by colonoscopy in October 2023. -Next colonoscopy due in 2030.  Osteopenia Diagnosed in 2023 via bone density test. -Next bone density test due in 2028.  Vaccinations Up to date on pneumonia and shingles vaccines. -Plan for flu shot and COVID booster when available next month.  General Health Maintenance / Followup Plans -Schedule follow-up appointment in 1-2 months to review chronic conditions and perform labs.        Return in about 2  months (around 03/07/2023) for chronic disease f/u.      Shirlee Latch, MD  [facility] [phone] (phone) [phone] (fax)  [facility]

## 2023-01-10 NOTE — Telephone Encounter (Signed)
err

## 2023-01-16 DIAGNOSIS — H401132 Primary open-angle glaucoma, bilateral, moderate stage: Secondary | ICD-10-CM | POA: Diagnosis not present

## 2023-01-22 DIAGNOSIS — H43813 Vitreous degeneration, bilateral: Secondary | ICD-10-CM | POA: Diagnosis not present

## 2023-01-22 DIAGNOSIS — H2513 Age-related nuclear cataract, bilateral: Secondary | ICD-10-CM | POA: Diagnosis not present

## 2023-01-22 DIAGNOSIS — H401132 Primary open-angle glaucoma, bilateral, moderate stage: Secondary | ICD-10-CM | POA: Diagnosis not present

## 2023-03-05 ENCOUNTER — Other Ambulatory Visit: Payer: Self-pay | Admitting: Physician Assistant

## 2023-03-05 DIAGNOSIS — I10 Essential (primary) hypertension: Secondary | ICD-10-CM

## 2023-03-05 NOTE — Telephone Encounter (Signed)
Requested Prescriptions  Pending Prescriptions Disp Refills   lisinopril-hydrochlorothiazide (ZESTORETIC) 20-25 MG tablet [Pharmacy Med Name: Lisinopril-hydroCHLOROthiazide 20-25 MG Oral Tablet] 90 tablet 0    Sig: Take 1 tablet by mouth once daily     Cardiovascular:  ACEI + Diuretic Combos Failed - 03/05/2023  6:51 AM      Failed - Na in normal range and within 180 days    Sodium  Date Value Ref Range Status  08/21/2022 141 134 - 144 mmol/L Final         Failed - K in normal range and within 180 days    Potassium  Date Value Ref Range Status  08/21/2022 4.5 3.5 - 5.2 mmol/L Final         Failed - Cr in normal range and within 180 days    Creatinine, Ser  Date Value Ref Range Status  08/21/2022 0.50 (L) 0.57 - 1.00 mg/dL Final         Failed - eGFR is 30 or above and within 180 days    GFR calc Af Amer  Date Value Ref Range Status  12/12/2019 114 >59 mL/min/1.73 Final    Comment:    **[facility] currently reports eGFR in compliance with the current**   recommendations of the [employer]. [facility] will   update reporting as new guidelines are published from the NKF-ASN   Task force.    GFR calc non Af Amer  Date Value Ref Range Status  12/12/2019 99 >59 mL/min/1.73 Final   eGFR  Date Value Ref Range Status  08/21/2022 104 >59 mL/min/1.73 Final         Failed - Valid encounter within last 6 months    Recent Outpatient Visits           1 month ago Welcome to [employer] preventive visit   [facility] [place], Marzella Schlein, MD   6 months ago Prediabetes   [place] [facility] [place], [place], PA-C   1 year ago Foot pain, right   [place] [facility] [place], [place], PA-C   1 year ago Essential hypertension   [facility] [place], [place], PA-C   1 year ago Essential hypertension   [facility] [place], Marzella Schlein, MD       Future Appointments              Tomorrow Beryle Flock, Marzella Schlein, MD [facility], [facility]            Passed - Patient is not pregnant      Passed - Last BP in normal range    BP Readings from Last 1 Encounters:  01/05/23 128/63

## 2023-03-06 ENCOUNTER — Ambulatory Visit (INDEPENDENT_AMBULATORY_CARE_PROVIDER_SITE_OTHER): Payer: Medicare Other | Admitting: Family Medicine

## 2023-03-06 ENCOUNTER — Encounter: Payer: Self-pay | Admitting: Family Medicine

## 2023-03-06 VITALS — BP 134/87 | HR 64 | Ht 62.0 in | Wt 157.9 lb

## 2023-03-06 DIAGNOSIS — Z23 Encounter for immunization: Secondary | ICD-10-CM

## 2023-03-06 DIAGNOSIS — I10 Essential (primary) hypertension: Secondary | ICD-10-CM

## 2023-03-06 DIAGNOSIS — E7800 Pure hypercholesterolemia, unspecified: Secondary | ICD-10-CM

## 2023-03-06 DIAGNOSIS — R7303 Prediabetes: Secondary | ICD-10-CM

## 2023-03-06 NOTE — Progress Notes (Signed)
   Established Patient Office Visit  Subjective   Patient ID: Kristen Wall, female    DOB: 11-29-1955  Age: 67 y.o. MRN: [id number]  Chief Complaint  Patient presents with   Follow-up    Flu shot and blood work     HPI  Discussed the use of AI scribe software for clinical note transcription with the patient, who gave verbal consent to proceed.  No issues with medications. Taking regularly. Did not have this AM b/c she hadn't eaten yet. BPs at home are well controlled though.     ROS    Objective:     BP 134/87 Comment: home reading  Pulse 64   Ht 5\' 2"  (1.575 m)   Wt 157 lb 14.4 oz (71.6 kg)   SpO2 99%   BMI 28.88 kg/m    Physical Exam Vitals reviewed.  Constitutional:      General: She is not in acute distress.    Appearance: Normal appearance. She is well-developed. She is not diaphoretic.  HENT:     Head: Normocephalic and atraumatic.  Eyes:     General: No scleral icterus.    Conjunctiva/sclera: Conjunctivae normal.  Neck:     Thyroid: No thyromegaly.  Cardiovascular:     Rate and Rhythm: Normal rate and regular rhythm.     Pulses: Normal pulses.     Heart sounds: Normal heart sounds. No murmur heard. Pulmonary:     Effort: Pulmonary effort is normal. No respiratory distress.     Breath sounds: Normal breath sounds. No wheezing, rhonchi or rales.  Musculoskeletal:     Cervical back: Neck supple.     Right lower leg: No edema.     Left lower leg: No edema.  Lymphadenopathy:     Cervical: No cervical adenopathy.  Skin:    General: Skin is warm and dry.     Findings: No rash.  Neurological:     Mental Status: She is alert and oriented to person, place, and time. Mental status is at baseline.  Psychiatric:        Mood and Affect: Mood normal.        Behavior: Behavior normal.      No results found for any visits on 03/06/23.    The ASCVD Risk score (Arnett DK, et al., 2019) failed to calculate for the following reasons:   Unable to  determine if patient is Non-Hispanic African American    Assessment & Plan:   Problem List Items Addressed This Visit       Cardiovascular and Mediastinum   Essential hypertension - Primary    Well controlled Continue lisinopril hydrochlorothiazide 20-25 mg daily Recheck metabolic panel F/u in 6 months       Relevant Orders   Comprehensive metabolic panel     Other   Hypercholesteremia    Previously well controlled Continue simvastatin 20 mg daily Repeat FLP and CMP      Relevant Orders   Comprehensive metabolic panel   Lipid panel   Prediabetes    Recommend low carb diet Recheck A1c      Relevant Orders   Hemoglobin A1c     Return in about 6 months (around 09/04/2023) for chronic disease f/u.    Shirlee Latch, MD

## 2023-03-06 NOTE — Assessment & Plan Note (Signed)
Recommend low carb diet °Recheck A1c  °

## 2023-03-06 NOTE — Assessment & Plan Note (Signed)
Well controlled Continue lisinopril hydrochlorothiazide 20-25 mg daily Recheck metabolic panel F/u in 6 months

## 2023-03-06 NOTE — Assessment & Plan Note (Signed)
Previously well controlled Continue simvastatin 20 mg daily Repeat FLP and CMP

## 2023-03-07 LAB — LIPID PANEL
Chol/HDL Ratio: 3.7 {ratio} (ref 0.0–4.4)
Cholesterol, Total: 223 mg/dL — ABNORMAL HIGH (ref 100–199)
HDL: 61 mg/dL (ref >39)
LDL Chol Calc (NIH): 118 mg/dL — ABNORMAL HIGH (ref 0–99)
Triglycerides: 254 mg/dL — ABNORMAL HIGH (ref 0–149)
VLDL Cholesterol Cal: 44 mg/dL — ABNORMAL HIGH (ref 5–40)

## 2023-03-07 LAB — COMPREHENSIVE METABOLIC PANEL
ALT: 24 [IU]/L (ref 0–32)
AST: 23 [IU]/L (ref 0–40)
Albumin: 4.6 g/dL (ref 3.9–4.9)
Alkaline Phosphatase: 70 [IU]/L (ref 44–121)
BUN/Creatinine Ratio: 22 (ref 12–28)
BUN: 12 mg/dL (ref 8–27)
Bilirubin Total: 0.3 mg/dL (ref 0.0–1.2)
CO2: 26 mmol/L (ref 20–29)
Calcium: 9.8 mg/dL (ref 8.7–10.3)
Chloride: 100 mmol/L (ref 96–106)
Creatinine, Ser: 0.54 mg/dL — ABNORMAL LOW (ref 0.57–1.00)
Globulin, Total: 2.7 g/dL (ref 1.5–4.5)
Glucose: 105 mg/dL — ABNORMAL HIGH (ref 70–99)
Potassium: 4.4 mmol/L (ref 3.5–5.2)
Sodium: 142 mmol/L (ref 134–144)
Total Protein: 7.3 g/dL (ref 6.0–8.5)
eGFR: 101 mL/min/{1.73_m2} (ref >59)

## 2023-03-07 LAB — HEMOGLOBIN A1C
Est. average glucose Bld gHb Est-mCnc: 120 mg/dL
Hgb A1c MFr Bld: 5.8 % — ABNORMAL HIGH (ref 4.8–5.6)

## 2023-04-07 ENCOUNTER — Ambulatory Visit
Admission: RE | Admit: 2023-04-07 | Discharge: 2023-04-07 | Disposition: A | Discharge status: Home / Self Care | Payer: Medicare Other | Source: Ambulatory Visit | Attending: Family Medicine | Admitting: Family Medicine

## 2023-04-07 DIAGNOSIS — Z1231 Encounter for screening mammogram for malignant neoplasm of breast: Secondary | ICD-10-CM | POA: Insufficient documentation

## 2023-06-03 ENCOUNTER — Other Ambulatory Visit: Payer: Self-pay | Admitting: Family Medicine

## 2023-06-03 DIAGNOSIS — I10 Essential (primary) hypertension: Secondary | ICD-10-CM

## 2023-07-27 DIAGNOSIS — H401132 Primary open-angle glaucoma, bilateral, moderate stage: Secondary | ICD-10-CM | POA: Diagnosis not present

## 2023-07-27 DIAGNOSIS — H2513 Age-related nuclear cataract, bilateral: Secondary | ICD-10-CM | POA: Diagnosis not present

## 2023-07-29 ENCOUNTER — Other Ambulatory Visit: Payer: Self-pay | Admitting: Physician Assistant

## 2023-07-29 DIAGNOSIS — E7800 Pure hypercholesterolemia, unspecified: Secondary | ICD-10-CM

## 2023-07-29 NOTE — Telephone Encounter (Signed)
 Requested Prescriptions  Pending Prescriptions Disp Refills   simvastatin (ZOCOR) 20 MG tablet [Pharmacy Med Name: Simvastatin 20 MG Oral Tablet] 90 tablet 1    Sig: Take 1 tablet by mouth in the evening     Cardiovascular:  Antilipid - Statins Failed - 07/29/2023  4:52 PM      Failed - Lipid Panel in normal range within the last 12 months    Cholesterol, Total  Date Value Ref Range Status  03/06/2023 223 (H) 100 - 199 mg/dL Final   LDL Chol Calc (NIH)  Date Value Ref Range Status  03/06/2023 118 (H) 0 - 99 mg/dL Final   HDL  Date Value Ref Range Status  03/06/2023 61 >39 mg/dL Final   Triglycerides  Date Value Ref Range Status  03/06/2023 254 (H) 0 - 149 mg/dL Final         Passed - Patient is not pregnant      Passed - Valid encounter within last 12 months    Recent Outpatient Visits           4 months ago Essential hypertension   [facility] [place], Marzella Schlein, MD   6 months ago Welcome to [employer] preventive visit   [facility] [place], Marzella Schlein, MD   11 months ago Prediabetes   [facility] [place], [place], PA-C   1 year ago Foot pain, right   [place] [facility] [place], [place], PA-C   1 year ago Essential hypertension   [facility] [place], [place], PA-C       Future Appointments             In 1 month Bacigalupo, Marzella Schlein, MD [facility], [facility]

## 2023-09-02 ENCOUNTER — Other Ambulatory Visit: Payer: Self-pay | Admitting: Family Medicine

## 2023-09-02 DIAGNOSIS — I10 Essential (primary) hypertension: Secondary | ICD-10-CM

## 2023-09-03 NOTE — Telephone Encounter (Signed)
 Requested medication (s) are due for refill today:   Yes  Requested medication (s) are on the active medication list:   Yes  Future visit scheduled:   Yes  Tomorrow 4/4 at 8:40 with Dr. Leonard Schwartz.    LOV 03/06/2023   Last ordered: 06/04/2023 #90, 0 refills  Unable to refill because labs are due per protocol.   Has an appt tomorrow.   Requested Prescriptions  Pending Prescriptions Disp Refills   lisinopril-hydrochlorothiazide (ZESTORETIC) 20-25 MG tablet [Pharmacy Med Name: Lisinopril-hydroCHLOROthiazide 20-25 MG Oral Tablet] 90 tablet 0    Sig: Take 1 tablet by mouth once daily     Cardiovascular:  ACEI + Diuretic Combos Failed - 09/03/2023  2:29 PM      Failed - Na in normal range and within 180 days    Sodium  Date Value Ref Range Status  03/06/2023 142 134 - 144 mmol/L Final         Failed - K in normal range and within 180 days    Potassium  Date Value Ref Range Status  03/06/2023 4.4 3.5 - 5.2 mmol/L Final         Failed - Cr in normal range and within 180 days    Creatinine, Ser  Date Value Ref Range Status  03/06/2023 0.54 (L) 0.57 - 1.00 mg/dL Final         Failed - eGFR is 30 or above and within 180 days    GFR calc Af Amer  Date Value Ref Range Status  12/12/2019 114 >59 mL/min/1.73 Final    Comment:    **[facility] currently reports eGFR in compliance with the current**   recommendations of the [employer]. [facility] will   update reporting as new guidelines are published from the NKF-ASN   Task force.    GFR calc non Af Amer  Date Value Ref Range Status  12/12/2019 99 >59 mL/min/1.73 Final   eGFR  Date Value Ref Range Status  03/06/2023 101 >59 mL/min/1.73 Final         Failed - Valid encounter within last 6 months    Recent Outpatient Visits   None     Future Appointments             Tomorrow Bacigalupo, Marzella Schlein, MD [facility], [facility]            Passed - Patient is not pregnant      Passed - Last BP in normal  range    BP Readings from Last 1 Encounters:  03/06/23 134/87

## 2023-09-04 ENCOUNTER — Encounter: Payer: Self-pay | Admitting: Family Medicine

## 2023-09-04 ENCOUNTER — Ambulatory Visit (INDEPENDENT_AMBULATORY_CARE_PROVIDER_SITE_OTHER): Payer: Self-pay | Admitting: Family Medicine

## 2023-09-04 VITALS — BP 124/70 | HR 62 | Ht 62.0 in | Wt 153.4 lb

## 2023-09-04 DIAGNOSIS — E7800 Pure hypercholesterolemia, unspecified: Secondary | ICD-10-CM | POA: Diagnosis not present

## 2023-09-04 DIAGNOSIS — I10 Essential (primary) hypertension: Secondary | ICD-10-CM | POA: Diagnosis not present

## 2023-09-04 DIAGNOSIS — R7303 Prediabetes: Secondary | ICD-10-CM | POA: Diagnosis not present

## 2023-09-04 NOTE — Assessment & Plan Note (Signed)
 Prediabetes with previous A1c levels in the prediabetic range. Monitoring is ongoing to prevent progression to diabetes. - Check HbA1c as part of routine labs - encourage low carb diet

## 2023-09-04 NOTE — Assessment & Plan Note (Signed)
 Hyperlipidemia is managed with simvastatin 20 mg daily. No reported side effects such as muscle aches. - Continue simvastatin 20 mg daily - Check lipid panel as part of routine labs

## 2023-09-04 NOTE — Progress Notes (Signed)
 Established patient visit   Patient: Kristen Wall   DOB: 10-27-1955   68 y.o. Female  MRN: [id number] Visit Date: 09/04/2023  Today's healthcare provider: Shirlee Latch, MD   Chief Complaint  Patient presents with   Medical Management of Chronic Issues   Hyperlipidemia    Numbness in fingers on right hand   Hypertension    Pt reports she monitors at home with no symptoms to report   Pre-diabetes    Possible low readings due to some dizziness   Subjective    Hyperlipidemia  Hypertension   HPI     Hyperlipidemia    Additional comments: Numbness in fingers on right hand        Hypertension    Additional comments: Pt reports she monitors at home with no symptoms to report        Pre-diabetes    Additional comments: Possible low readings due to some dizziness      Last edited by Acey Lav, CMA on 09/04/2023  8:46 AM.       Discussed the use of AI scribe software for clinical note transcription with the patient, who gave verbal consent to proceed.  History of Present Illness   The patient, with a history of hypertension and hyperlipidemia, presents for a routine follow-up. She reports no new concerns and is doing well. She continues to take lisinopril-HCTZ 20/25mg  and simvastatin 20mg  daily without any side effects. The patient's blood pressure is well controlled on the current regimen. She has a history of prediabetes, and her A1c levels are regularly monitored.         Medications: Outpatient Medications Prior to Visit  Medication Sig   brimonidine (ALPHAGAN P) 0.1 % SOLN    Cholecalciferol (VITAMIN D) 2000 UNITS tablet Take by mouth.   latanoprost (XALATAN) 0.005 % ophthalmic solution INSTILL 1 DROP INTO EACH EYE AT BEDTIME   lisinopril-hydrochlorothiazide (ZESTORETIC) 20-25 MG tablet Take 1 tablet by mouth once daily   MULTIPLE VITAMINS PO Take by mouth.   OMEGA 3-6-9 FATTY ACIDS PO Take by mouth.   simvastatin (ZOCOR) 20 MG tablet Take  1 tablet by mouth in the evening   No facility-administered medications prior to visit.    Review of Systems     Objective    BP 124/70 (BP Location: Right Arm, Patient Position: Sitting, Cuff Size: Normal)   Pulse 62   Ht 5\' 2"  (1.575 m)   Wt 153 lb 6.4 oz (69.6 kg)   SpO2 99%   BMI 28.06 kg/m    Physical Exam Vitals reviewed.  Constitutional:      General: She is not in acute distress.    Appearance: Normal appearance. She is well-developed. She is not diaphoretic.  HENT:     Head: Normocephalic and atraumatic.  Eyes:     General: No scleral icterus.    Conjunctiva/sclera: Conjunctivae normal.  Neck:     Thyroid: No thyromegaly.  Cardiovascular:     Rate and Rhythm: Normal rate and regular rhythm.     Heart sounds: Normal heart sounds. No murmur heard. Pulmonary:     Effort: Pulmonary effort is normal. No respiratory distress.     Breath sounds: Normal breath sounds. No wheezing, rhonchi or rales.  Musculoskeletal:     Cervical back: Neck supple.     Right lower leg: No edema.     Left lower leg: No edema.  Lymphadenopathy:     Cervical: No cervical adenopathy.  Skin:  General: Skin is warm and dry.     Findings: No rash.  Neurological:     Mental Status: She is alert and oriented to person, place, and time. Mental status is at baseline.  Psychiatric:        Mood and Affect: Mood normal.        Behavior: Behavior normal.      No results found for any visits on 09/04/23.  Assessment & Plan     Problem List Items Addressed This Visit       Cardiovascular and Mediastinum   Essential hypertension - Primary   Hypertension is well-controlled with current medication regimen. Blood pressure readings are within normal range. She is on a combination pill of lisinopril and hydrochlorothiazide (20 mg/25 mg). - Continue lisinopril HCTZ 20 mg/25 mg daily      Relevant Orders   Comprehensive metabolic panel with GFR     Other   Hypercholesteremia    Hyperlipidemia is managed with simvastatin 20 mg daily. No reported side effects such as muscle aches. - Continue simvastatin 20 mg daily - Check lipid panel as part of routine labs      Relevant Orders   Comprehensive metabolic panel with GFR   Lipid panel   Prediabetes   Prediabetes with previous A1c levels in the prediabetic range. Monitoring is ongoing to prevent progression to diabetes. - Check HbA1c as part of routine labs - encourage low carb diet      Relevant Orders   Hemoglobin A1c        General Health Maintenance Routine health maintenance is being conducted. She is on Medicare and has [employer], allowing for both a physical and a wellness visit annually. The physical includes labs and screenings, while the wellness visit involves questionnaires conducted by a nurse over the phone. - Order routine labs including cholesterol, kidney, and liver function tests - Schedule physical exam in 6 months - Schedule wellness visit with nurse       Return in about 6 months (around 03/05/2024) for AWV with N[facility] and CPE with PCP.       Shirlee Latch, MD  [facility] [phone] (phone) [phone] (fax)  [facility]

## 2023-09-04 NOTE — Assessment & Plan Note (Signed)
 Hypertension is well-controlled with current medication regimen. Blood pressure readings are within normal range. She is on a combination pill of lisinopril and hydrochlorothiazide (20 mg/25 mg). - Continue lisinopril HCTZ 20 mg/25 mg daily

## 2023-09-05 LAB — LIPID PANEL
Chol/HDL Ratio: 3.3 ratio (ref 0.0–4.4)
Cholesterol, Total: 167 mg/dL (ref 100–199)
HDL: 50 mg/dL (ref >39)
LDL Chol Calc (NIH): 91 mg/dL (ref 0–99)
Triglycerides: 150 mg/dL — ABNORMAL HIGH (ref 0–149)
VLDL Cholesterol Cal: 26 mg/dL (ref 5–40)

## 2023-09-05 LAB — COMPREHENSIVE METABOLIC PANEL WITH GFR
ALT: 34 IU/L — ABNORMAL HIGH (ref 0–32)
AST: 24 IU/L (ref 0–40)
Albumin: 4.3 g/dL (ref 3.9–4.9)
Alkaline Phosphatase: 76 IU/L (ref 44–121)
BUN/Creatinine Ratio: 21 (ref 12–28)
BUN: 11 mg/dL (ref 8–27)
Bilirubin Total: 0.3 mg/dL (ref 0.0–1.2)
CO2: 25 mmol/L (ref 20–29)
Calcium: 9.6 mg/dL (ref 8.7–10.3)
Chloride: 101 mmol/L (ref 96–106)
Creatinine, Ser: 0.52 mg/dL — ABNORMAL LOW (ref 0.57–1.00)
Globulin, Total: 2.7 g/dL (ref 1.5–4.5)
Glucose: 106 mg/dL — ABNORMAL HIGH (ref 70–99)
Potassium: 4.9 mmol/L (ref 3.5–5.2)
Sodium: 142 mmol/L (ref 134–144)
Total Protein: 7 g/dL (ref 6.0–8.5)
eGFR: 102 mL/min/{1.73_m2} (ref >59)

## 2023-09-05 LAB — HEMOGLOBIN A1C
Est. average glucose Bld gHb Est-mCnc: 120 mg/dL
Hgb A1c MFr Bld: 5.8 % — ABNORMAL HIGH (ref 4.8–5.6)

## 2023-09-07 ENCOUNTER — Encounter: Payer: Self-pay | Admitting: Family Medicine

## 2024-01-19 DIAGNOSIS — H401132 Primary open-angle glaucoma, bilateral, moderate stage: Secondary | ICD-10-CM | POA: Diagnosis not present

## 2024-01-21 ENCOUNTER — Other Ambulatory Visit: Payer: Self-pay | Admitting: Family Medicine

## 2024-01-21 DIAGNOSIS — E7800 Pure hypercholesterolemia, unspecified: Secondary | ICD-10-CM

## 2024-01-25 DIAGNOSIS — H2513 Age-related nuclear cataract, bilateral: Secondary | ICD-10-CM | POA: Diagnosis not present

## 2024-01-25 DIAGNOSIS — H401132 Primary open-angle glaucoma, bilateral, moderate stage: Secondary | ICD-10-CM | POA: Diagnosis not present

## 2024-01-25 DIAGNOSIS — H43813 Vitreous degeneration, bilateral: Secondary | ICD-10-CM | POA: Diagnosis not present

## 2024-02-25 ENCOUNTER — Other Ambulatory Visit: Payer: Self-pay | Admitting: Family Medicine

## 2024-02-25 DIAGNOSIS — Z1231 Encounter for screening mammogram for malignant neoplasm of breast: Secondary | ICD-10-CM

## 2024-03-01 ENCOUNTER — Other Ambulatory Visit: Payer: Self-pay | Admitting: Family Medicine

## 2024-03-01 DIAGNOSIS — I10 Essential (primary) hypertension: Secondary | ICD-10-CM

## 2024-03-02 NOTE — Telephone Encounter (Signed)
 Requested Prescriptions  Pending Prescriptions Disp Refills   lisinopril -hydrochlorothiazide  (ZESTORETIC ) 20-25 MG tablet [Pharmacy Med Name: Lisinopril -hydroCHLOROthiazide  20-25 MG Oral Tablet] 90 tablet 0    Sig: Take 1 tablet by mouth once daily     Cardiovascular:  ACEI + Diuretic Combos Failed - 03/02/2024  2:59 PM      Failed - Cr in normal range and within 180 days    Creatinine, Ser  Date Value Ref Range Status  09/04/2023 0.52 (L) 0.57 - 1.00 mg/dL Final         Passed - Na in normal range and within 180 days    Sodium  Date Value Ref Range Status  09/04/2023 142 134 - 144 mmol/L Final         Passed - K in normal range and within 180 days    Potassium  Date Value Ref Range Status  09/04/2023 4.9 3.5 - 5.2 mmol/L Final         Passed - eGFR is 30 or above and within 180 days    GFR calc Af Amer  Date Value Ref Range Status  12/12/2019 114 >59 mL/min/1.73 Final    Comment:    **[facility] currently reports eGFR in compliance with the current**   recommendations of the [employer]. [facility] will   update reporting as new guidelines are published from the NKF-ASN   Task force.    GFR calc non Af Amer  Date Value Ref Range Status  12/12/2019 99 >59 mL/min/1.73 Final   eGFR  Date Value Ref Range Status  09/04/2023 102 >59 mL/min/1.73 Final         Passed - Patient is not pregnant      Passed - Last BP in normal range    BP Readings from Last 1 Encounters:  09/04/23 124/70         Passed - Valid encounter within last 6 months    Recent Outpatient Visits           6 months ago Essential hypertension   [facility] [place], Jon HERO, MD       Future Appointments             In 4 weeks Bacigalupo, Jon HERO, MD [facility], [place]

## 2024-03-30 ENCOUNTER — Ambulatory Visit

## 2024-03-31 ENCOUNTER — Ambulatory Visit (INDEPENDENT_AMBULATORY_CARE_PROVIDER_SITE_OTHER): Admitting: Family Medicine

## 2024-03-31 ENCOUNTER — Encounter: Payer: Self-pay | Admitting: Family Medicine

## 2024-03-31 VITALS — BP 125/72 | HR 70 | Ht 62.0 in | Wt 152.5 lb

## 2024-03-31 DIAGNOSIS — Z23 Encounter for immunization: Secondary | ICD-10-CM | POA: Diagnosis not present

## 2024-03-31 DIAGNOSIS — E7800 Pure hypercholesterolemia, unspecified: Secondary | ICD-10-CM

## 2024-03-31 DIAGNOSIS — G5601 Carpal tunnel syndrome, right upper limb: Secondary | ICD-10-CM | POA: Diagnosis not present

## 2024-03-31 DIAGNOSIS — R7303 Prediabetes: Secondary | ICD-10-CM

## 2024-03-31 DIAGNOSIS — I10 Essential (primary) hypertension: Secondary | ICD-10-CM

## 2024-03-31 DIAGNOSIS — M7551 Bursitis of right shoulder: Secondary | ICD-10-CM

## 2024-03-31 DIAGNOSIS — Z0000 Encounter for general adult medical examination without abnormal findings: Secondary | ICD-10-CM | POA: Diagnosis not present

## 2024-03-31 MED ORDER — SIMVASTATIN 20 MG PO TABS: 20.0000 mg | ORAL_TABLET | Freq: Every evening | ORAL | 1 refills | Status: AC

## 2024-03-31 MED ORDER — LISINOPRIL-HYDROCHLOROTHIAZIDE 20-25 MG PO TABS: 1.0000 | ORAL_TABLET | Freq: Every day | ORAL | 1 refills | Status: AC

## 2024-03-31 MED ORDER — MELOXICAM 15 MG PO TABS: 15.0000 mg | ORAL_TABLET | Freq: Every day | ORAL | 0 refills | Status: AC

## 2024-03-31 NOTE — Progress Notes (Signed)
 Complete physical exam   Patient: Kristen Wall   DOB: 02-16-1956   68 y.o. Female  MRN: [id number] Visit Date: 03/31/2024  Today's healthcare provider: Jon Eva, MD   Chief Complaint  Patient presents with   Annual Exam    Last completed  Diet -  Well balanced Exercise - walking daily for 30 minutes Feeling - fairly well due to lack of activity at times Sleeping - fairly well due to waking up in the middle of the night sometimes and have a hard time going back to sleep Concerns - right shoulder still hurting and three of fingers on right hand are still tingling   Subjective    Kristen Wall is a 68 y.o. female who presents today for a complete physical exam.   Discussed the use of AI scribe software for clinical note transcription with the patient, who gave verbal consent to proceed.  History of Present Illness   Kristen Wall is a 68 year old female who presents for an annual physical exam.  She has ongoing right shoulder pain for over six months, worsened by sleeping on the affected side. She does not take any anti-inflammatory medications for this issue.  Intermittent tingling occurs in the thumb and first two fingers, associated with activities involving wrist bending, such as computer use. The tingling is absent in the morning.  Her hypertension is well-controlled with lisinopril -HCTZ 20-25 mg daily. She takes simvastatin  20 mg daily for hyperlipidemia. Prediabetes is monitored with regular A1c checks.        Last depression screening scores    03/31/2024    9:48 AM 09/04/2023    8:47 AM 01/05/2023    8:53 AM  PHQ 2/9 Scores  PHQ - 2 Score 1 0 0  PHQ- 9 Score 3     Last fall risk screening    09/04/2023    8:47 AM  Fall Risk   Falls in the past year? 1  Number falls in past yr: 0  Injury with Fall? 0  Risk for fall due to : No Fall Risks  Follow up Falls evaluation completed        Medications: Outpatient Medications Prior to Visit   Medication Sig   brimonidine (ALPHAGAN P) 0.1 % SOLN    Cholecalciferol (VITAMIN D) 2000 UNITS tablet Take by mouth.   latanoprost (XALATAN) 0.005 % ophthalmic solution INSTILL 1 DROP INTO EACH EYE AT BEDTIME   MULTIPLE VITAMINS PO Take by mouth.   OMEGA 3-6-9 FATTY ACIDS PO Take by mouth.   [DISCONTINUED] lisinopril -hydrochlorothiazide  (ZESTORETIC ) 20-25 MG tablet Take 1 tablet by mouth once daily   [DISCONTINUED] simvastatin  (ZOCOR ) 20 MG tablet Take 1 tablet by mouth in the evening   No facility-administered medications prior to visit.    Review of Systems    Objective    BP 125/72 (BP Location: Right Arm, Patient Position: Sitting, Cuff Size: Normal)   Pulse 70   Ht 5' 2 (1.575 m)   Wt 152 lb 8 oz (69.2 kg)   SpO2 99%   BMI 27.89 kg/m    Physical Exam Vitals reviewed.  Constitutional:      General: She is not in acute distress.    Appearance: Normal appearance. She is well-developed. She is not diaphoretic.  HENT:     Head: Normocephalic and atraumatic.     Right Ear: Tympanic membrane, ear canal and external ear normal.     Left Ear: Tympanic membrane, ear canal and external ear  normal.     Nose: Nose normal.     Mouth/Throat:     Mouth: Mucous membranes are moist.     Pharynx: Oropharynx is clear. No oropharyngeal exudate.  Eyes:     General: No scleral icterus.    Conjunctiva/sclera: Conjunctivae normal.     Pupils: Pupils are equal, round, and reactive to light.  Neck:     Thyroid : No thyromegaly.  Cardiovascular:     Rate and Rhythm: Normal rate and regular rhythm.     Heart sounds: Normal heart sounds. No murmur heard. Pulmonary:     Effort: Pulmonary effort is normal. No respiratory distress.     Breath sounds: Normal breath sounds. No wheezing or rales.  Abdominal:     General: There is no distension.     Palpations: Abdomen is soft.     Tenderness: There is no abdominal tenderness.  Musculoskeletal:        General: No deformity.     Cervical  back: Neck supple.     Right lower leg: No edema.     Left lower leg: No edema.     Comments: R Shoulder: Inspection reveals no abnormalities, atrophy or asymmetry. Palpation is normal with no tenderness over AC joint or bicipital groove. ROM is full in all planes. Rotator cuff strength normal throughout. No signs of impingement with negative Neer and Hawkin's tests  Lymphadenopathy:     Cervical: No cervical adenopathy.  Skin:    General: Skin is warm and dry.     Findings: No rash.  Neurological:     Mental Status: She is alert and oriented to person, place, and time. Mental status is at baseline.     Gait: Gait normal.  Psychiatric:        Mood and Affect: Mood normal.        Behavior: Behavior normal.        Thought Content: Thought content normal.      No results found for any visits on 03/31/24.  Assessment & Plan    Routine Health Maintenance and Physical Exam  Exercise Activities and Dietary recommendations  Goals      Exercise 150 minutes per week (moderate activity)     Reduce portion size     Decrease carbohydrates.         Immunization History  Administered Date(s) Administered   Fluad Trivalent(High Dose 65+) 03/06/2023   INFLUENZA, HIGH DOSE SEASONAL PF 03/03/2017, 03/31/2024   Influenza,inj,Quad PF,6+ Mos 06/17/2021   Moderna Sars-Covid-2 Vaccination 10/15/2019, 11/12/2019   PNEUMOCOCCAL CONJUGATE-20 12/30/2021   Td 06/23/1995, 12/02/2017   Zoster Recombinant(Shingrix ) 12/13/2020, 06/17/2021    Health Maintenance  Topic Date Due   Medicare Annual Wellness (AWV)  01/05/2024   COVID-19 Vaccine (3 - 2025-26 season) 02/01/2024   Mammogram  04/06/2025   DEXA SCAN  03/28/2027   DTaP/Tdap/Td (3 - Tdap) 12/03/2027   Colonoscopy  03/03/2029   Pneumococcal Vaccine: 50+ Years  Completed   Influenza Vaccine  Completed   Hepatitis C Screening  Completed   Zoster Vaccines- Shingrix   Completed   Meningococcal B Vaccine  Aged Out   Fecal DNA (Cologuard)   Discontinued    Discussed health benefits of physical activity, and encouraged her to engage in regular exercise appropriate for her age and condition.  Problem List Items Addressed This Visit       Cardiovascular and Mediastinum   Essential hypertension   Blood pressure is well controlled on current medication regimen. - Continue current  antihypertensive medication regimen      Relevant Medications   lisinopril -hydrochlorothiazide  (ZESTORETIC ) 20-25 MG tablet   simvastatin  (ZOCOR ) 20 MG tablet   Other Relevant Orders   Comprehensive metabolic panel with GFR     Nervous and Auditory   Carpal tunnel syndrome on right     Other   Hypercholesteremia   Currently managed with simvastatin  20 mg daily. No changes indicated at this time. - Continue simvastatin  20 mg daily - Monitor cholesterol levels with routine labs      Relevant Medications   lisinopril -hydrochlorothiazide  (ZESTORETIC ) 20-25 MG tablet   simvastatin  (ZOCOR ) 20 MG tablet   Other Relevant Orders   Comprehensive metabolic panel with GFR   Lipid panel   Prediabetes   Prediabetes being monitored with A1c levels. - Monitor A1c levels with routine labs      Relevant Orders   Hemoglobin A1c   Other Visit Diagnoses       Encounter for annual physical exam    -  Primary   Relevant Orders   Hemoglobin A1c   Comprehensive metabolic panel with GFR   Lipid panel     Immunization due       Relevant Orders   Flu vaccine HIGH DOSE PF(Fluzone Trivalent) (Completed)     Bursitis of right shoulder               Bursitis of right shoulder Chronic right shoulder pain, exacerbated by sleeping on it. Differential includes arthritis and bursitis, with bursitis being more likely due to pain with compression. No significant tenderness or movement restriction during examination. - Prescribe meloxicam for one month, to be taken once daily - Advise use of ice on shoulder before bed and after activities - Consider steroid  injection if no improvement after one month of meloxicam - Provide education on bursitis  Carpal tunnel syndrome, right hand Intermittent tingling in thumb and first two fingers of right hand, consistent with carpal tunnel syndrome. Symptoms exacerbated by wrist flexion, likely related to prolonged computer use. No morning symptoms reported. - Advise avoiding wrist flexion during activities - Recommend use of wrist brace during sleep to prevent wrist flexion - Educate on carpal tunnel syndrome management  Adult Wellness Visit Annual physical examination conducted. Blood pressure well controlled. Scheduled for mammogram in a couple of weeks. Flu shot administered. Colonoscopy due in 2030. Bone density, shingles, and pneumonia vaccinations up to date. - Perform routine labs including cholesterol, kidney and liver function, and A1c for prediabetes monitoring - Ensure mammogram appointment is kept - Continue current medications for hypertension and hyperlipidemia - Provide refills for medications as needed          Return in about 6 months (around 09/29/2024) for chronic disease f/u.     Jon Eva, MD  [facility] [phone] (phone) [phone] (fax)  [facility]

## 2024-03-31 NOTE — Assessment & Plan Note (Signed)
 Prediabetes being monitored with A1c levels. - Monitor A1c levels with routine labs

## 2024-03-31 NOTE — Assessment & Plan Note (Signed)
 Blood pressure is well controlled on current medication regimen. - Continue current antihypertensive medication regimen

## 2024-03-31 NOTE — Assessment & Plan Note (Signed)
 Currently managed with simvastatin  20 mg daily. No changes indicated at this time. - Continue simvastatin  20 mg daily - Monitor cholesterol levels with routine labs

## 2024-04-01 LAB — COMPREHENSIVE METABOLIC PANEL WITH GFR
ALT: 16 IU/L (ref 0–32)
AST: 17 IU/L (ref 0–40)
Albumin: 4.5 g/dL (ref 3.9–4.9)
Alkaline Phosphatase: 66 IU/L (ref 49–135)
BUN/Creatinine Ratio: 22 (ref 12–28)
BUN: 14 mg/dL (ref 8–27)
Bilirubin Total: 0.3 mg/dL (ref 0.0–1.2)
CO2: 26 mmol/L (ref 20–29)
Calcium: 9.8 mg/dL (ref 8.7–10.3)
Chloride: 101 mmol/L (ref 96–106)
Creatinine, Ser: 0.63 mg/dL (ref 0.57–1.00)
Globulin, Total: 2.6 g/dL (ref 1.5–4.5)
Glucose: 105 mg/dL — ABNORMAL HIGH (ref 70–99)
Potassium: 4.7 mmol/L (ref 3.5–5.2)
Sodium: 143 mmol/L (ref 134–144)
Total Protein: 7.1 g/dL (ref 6.0–8.5)
eGFR: 97 mL/min/1.73 (ref >59)

## 2024-04-01 LAB — LIPID PANEL
Chol/HDL Ratio: 3 ratio (ref 0.0–4.4)
Cholesterol, Total: 198 mg/dL (ref 100–199)
HDL: 67 mg/dL (ref >39)
LDL Chol Calc (NIH): 111 mg/dL — ABNORMAL HIGH (ref 0–99)
Triglycerides: 113 mg/dL (ref 0–149)
VLDL Cholesterol Cal: 20 mg/dL (ref 5–40)

## 2024-04-01 LAB — HEMOGLOBIN A1C
Est. average glucose Bld gHb Est-mCnc: 105 mg/dL
Hgb A1c MFr Bld: 5.3 % (ref 4.8–5.6)

## 2024-04-04 ENCOUNTER — Ambulatory Visit: Payer: Self-pay | Admitting: Family Medicine

## 2024-04-06 ENCOUNTER — Ambulatory Visit: Admitting: Emergency Medicine

## 2024-04-06 VITALS — Ht 62.0 in | Wt 152.0 lb

## 2024-04-06 DIAGNOSIS — Z0000 Encounter for general adult medical examination without abnormal findings: Secondary | ICD-10-CM

## 2024-04-06 NOTE — Progress Notes (Signed)
 Subjective:   Kristen Wall is a 68 y.o. female who presents for a Medicare Annual Wellness Visit.  Allergies (verified) Patient has no known allergies.   History: History reviewed. No pertinent past medical history. Past Surgical History:  Procedure Laterality Date   COLONOSCOPY WITH PROPOFOL  N/A 03/03/2022   Procedure: COLONOSCOPY WITH PROPOFOL ;  Surgeon: Therisa Bi, MD;  Location: [facility] ENDOSCOPY;  Service: Gastroenterology;  Laterality: N/A;   TUBAL LIGATION  1995   Family History  Problem Relation Age of Onset   Arthritis Mother    Diabetes Mother    Hypertension Mother    Coronary artery disease Mother    Esophageal cancer Father    Heart attack Brother        Age of death: 48   Hypertension Daughter    Heart attack Maternal Grandmother    Breast cancer Cousin        3 pat cousins   Breast cancer Other 34       2 nieces   Social History   Occupational History   Occupation: [employer]    Comment: Full-Time   Occupation: retired  Tobacco Use   Smoking status: Former    Current packs/day: 0.00    Average packs/day: 0.5 packs/day for 35.0 years (17.5 ttl pk-yrs)    Types: Cigarettes    Start date: 70    Quit date: 06/03/2007    Years since quitting: 16.8   Smokeless tobacco: Never  Vaping Use   Vaping status: Never Used  Substance and Sexual Activity   Alcohol use: Yes    Comment: Occasional, Drinks beer for social events. 1-2 drinks Monthly or less   Drug use: No   Sexual activity: Not on file   Tobacco Counseling Counseling given: Not Answered  SDOH Screenings   Food Insecurity: No Food Insecurity (04/06/2024)  Housing: Low Risk  (04/06/2024)  Transportation Needs: No Transportation Needs (04/06/2024)  Utilities: Not At Risk (04/06/2024)  Alcohol Screen: Low Risk  (03/04/2023)  Depression (PHQ2-9): Low Risk  (04/06/2024)  Financial Resource Strain: Low Risk  (03/04/2023)  Physical Activity: Sufficiently Active (04/06/2024)  Social Connections:  Moderately Integrated (04/06/2024)  Stress: No Stress Concern Present (04/06/2024)  Tobacco Use: Medium Risk (04/06/2024)  Health Literacy: Adequate Health Literacy (04/06/2024)   Depression Screen    04/06/2024    9:08 AM 03/31/2024    9:48 AM 09/04/2023    8:47 AM 01/05/2023    8:53 AM 08/21/2022   10:56 AM 08/21/2022   10:55 AM 12/30/2021    1:54 PM  PHQ 2/9 Scores  PHQ - 2 Score 1 1 0 0 0 0 0  PHQ- 9 Score 3 3   0 0 0     Goals Addressed               This Visit's Progress     eating healthier (pt-stated)         Visit info / Clinical Intake: Medicare Wellness Visit Type:: Subsequent Annual Wellness Visit Medicare Wellness Visit Mode:: Telephone If telephone:: video declined Interpreter Needed?: No Pre-visit prep was completed: yes AWV questionnaire completed by patient prior to visit?: no Living arrangements:: lives with spouse/significant other Patient's Overall Health Status Rating: very good Typical amount of pain: some Does pain affect daily life?: no Are you currently prescribed opioids?: no  Dietary Habits and Nutritional Risks How many meals a day?: 3 Eats fruit and vegetables daily?: yes (most days) Most meals are obtained by: preparing own meals In  the last 2 weeks, have you had any of the following?: -- (none) Diabetic:: no  Functional Status Activities of Daily Living (to include ambulation/medication): Independent Ambulation: Independent with device- listed below Home Assistive Devices/Equipment: Eyeglasses Medication Administration: Independent Home Management: Independent Manage your own finances?: yes Primary transportation is: driving Concerns about vision?: no *vision screening is required for WTM* Concerns about hearing?: no  Fall Screening Falls in the past year?: 0 Number of falls in past year: 0 Was there an injury with Fall?: 0 Fall Risk Category Calculator: 0 Patient Fall Risk Level: Low Fall Risk  Fall Risk Patient at Risk for  Falls Due to: No Fall Risks Fall risk Follow up: Falls evaluation completed  Home and Transportation Safety: All rugs have non-skid backing?: yes All stairs or steps have railings?: yes Grab bars in the bathtub or shower?: (!) no Have non-skid surface in bathtub or shower?: yes Good home lighting?: yes Regular seat belt use?: yes Hospital stays in the last year:: no  Cognitive Assessment Difficulty concentrating, remembering, or making decisions? : no Will 6CIT or Mini Cog be Completed: yes What year is it?: 0 points What month is it?: 0 points Give patient an address phrase to remember (5 components): [address] [place] About what time is it?: 0 points Count backwards from 20 to 1: 0 points Say the months of the year in reverse: 0 points Repeat the address phrase from earlier: 0 points 6 CIT Score: 0 points  Advance Directives (For Healthcare) Does Patient Have a Medical Advance Directive?: No Would patient like information on creating a medical advance directive?: Yes (MAU/Ambulatory/Procedural Areas - Information given)  Reviewed/Updated  Reviewed/Updated: All        Objective:    Today's Vitals   04/06/24 0851  Weight: 152 lb (68.9 kg)  Height: 5' 2 (1.575 m)   Body mass index is 27.8 kg/m.  Current Medications (verified) Outpatient Encounter Medications as of 04/06/2024  Medication Sig   brimonidine (ALPHAGAN) 0.2 % ophthalmic solution Place 1 drop into both eyes 2 (two) times daily.   Cholecalciferol (VITAMIN D) 2000 UNITS tablet Take by mouth.   latanoprost (XALATAN) 0.005 % ophthalmic solution INSTILL 1 DROP INTO EACH EYE AT BEDTIME   lisinopril -hydrochlorothiazide  (ZESTORETIC ) 20-25 MG tablet Take 1 tablet by mouth daily.   meloxicam (MOBIC) 15 MG tablet Take 1 tablet (15 mg total) by mouth daily.   MULTIPLE VITAMINS PO Take by mouth.   OMEGA 3-6-9 FATTY ACIDS PO Take by mouth.   simvastatin  (ZOCOR ) 20 MG tablet Take 1 tablet (20 mg total) by mouth  every evening.   [DISCONTINUED] brimonidine (ALPHAGAN P) 0.1 % SOLN    No facility-administered encounter medications on file as of 04/06/2024.   Hearing/Vision screen Hearing Screening - Comments:: Denies hearing loss  Vision Screening - Comments:: UTD @ Dr. Jaye Jacobs [place] Immunizations and Health Maintenance Health Maintenance  Topic Date Due   COVID-19 Vaccine (3 - 2025-26 season) 02/01/2024   Mammogram  04/06/2025   Medicare Annual Wellness (AWV)  04/06/2025   DEXA SCAN  03/28/2027   DTaP/Tdap/Td (3 - Tdap) 12/03/2027   Colonoscopy  03/03/2029   Pneumococcal Vaccine: 50+ Years  Completed   Influenza Vaccine  Completed   Hepatitis C Screening  Completed   Zoster Vaccines- Shingrix   Completed   Meningococcal B Vaccine  Aged Out   Fecal DNA (Cologuard)  Discontinued        Assessment/Plan:  This is a routine wellness examination  for Morrissa.  Patient Care Team: Myrla Jon HERO, MD as PCP - General (Family Medicine) Jaye Fallow, MD as Referring Physician (Ophthalmology)  I have personally reviewed and noted the following in the patient's chart:   Medical and social history Use of alcohol, tobacco or illicit drugs  Current medications and supplements including opioid prescriptions. Functional ability and status Nutritional status Physical activity Advanced directives List of other physicians Hospitalizations, surgeries, and ER visits in previous 12 months Vitals Screenings to include cognitive, depression, and falls Referrals and appointments  No orders of the defined types were placed in this encounter.  In addition, I have reviewed and discussed with patient certain preventive protocols, quality metrics, and best practice recommendations. A written personalized care plan for preventive services as well as general preventive health recommendations were provided to patient.   Vina Ned, CMA   04/06/2024   Return in 1 year (on  04/06/2025).  After Visit Summary: (MyChart) Due to this being a telephonic visit, the after visit summary with patients personalized plan was offered to patient via MyChart   Nurse Notes:  Declined Covid vaccine

## 2024-04-06 NOTE — Progress Notes (Signed)
 Visit Complete: Virtual I connected with this patient by a audio enabled telemedicine application and verified that I am speaking with the correct person using two identifiers.  Patient Location: Home Provider Location: Home Office  I discussed the limitations of evaluation and management by telemedicine. The patient expressed understanding and agreed to proceed.  Persons Participating in Visit: Patient

## 2024-04-06 NOTE — Patient Instructions (Signed)
 Ms. Keniston,  Thank you for taking the time for your Medicare Wellness Visit. I appreciate your continued commitment to your health goals. Please review the care plan we discussed, and feel free to reach out if I can assist you further.  Please note that Annual Wellness Visits do not include a physical exam. Some assessments may be limited, especially if the visit was conducted virtually. If needed, we may recommend an in-person follow-up with your provider.  Ongoing Care Seeing your primary care provider every 3 to 6 months helps us  monitor your health and provide consistent, personalized care.   Referrals If a referral was made during today's visit and you haven't received any updates within two weeks, please contact the referred provider directly to check on the status.  Recommended Screenings:  Health Maintenance  Topic Date Due   Medicare Annual Wellness Visit  01/05/2024   COVID-19 Vaccine (3 - 2025-26 season) 02/01/2024   Breast Cancer Screening  04/06/2025   DEXA scan (bone density measurement)  03/28/2027   DTaP/Tdap/Td vaccine (3 - Tdap) 12/03/2027   Colon Cancer Screening  03/03/2029   Pneumococcal Vaccine for age over 69  Completed   Flu Shot  Completed   Hepatitis C Screening  Completed   Zoster (Shingles) Vaccine  Completed   Meningitis B Vaccine  Aged Out   Cologuard (Stool DNA test)  Discontinued       04/06/2024    8:57 AM  Advanced Directives  Does Patient Have a Medical Advance Directive? No  Would patient like information on creating a medical advance directive? Yes (MAU/Ambulatory/Procedural Areas - Information given)    Vision: Annual vision screenings are recommended for early detection of glaucoma, cataracts, and diabetic retinopathy. These exams can also reveal signs of chronic conditions such as diabetes and high blood pressure.  Dental: Annual dental screenings help detect early signs of oral cancer, gum disease, and other conditions linked to overall  health, including heart disease and diabetes.  Please see the attached documents for additional preventive care recommendations.   Fall Prevention in the Home, Adult Falls can cause injuries and affect people of all ages. There are many simple things that you can do to make your home safe and to help prevent falls. If you need it, ask for help making these changes. What actions can I take to prevent falls? General information Use good lighting in all rooms. Make sure to: Replace any light bulbs that burn out. Turn on lights if it is dark and use night-lights. Keep items that you use often in easy-to-reach places. Lower the shelves around your home if needed. Move furniture so that there are clear paths around it. Do not keep throw rugs or other things on the floor that can make you trip. If any of your floors are uneven, fix them. Add color or contrast paint or tape to clearly mark and help you see: Grab bars or handrails. First and last steps of staircases. Where the edge of each step is. If you use a ladder or stepladder: Make sure that it is fully opened. Do not climb a closed ladder. Make sure the sides of the ladder are locked in place. Have someone hold the ladder while you use it. Know where your pets are as you move through your home. What can I do in the bathroom?     Keep the floor dry. Clean up any water that is on the floor right away. Remove soap buildup in the bathtub or shower.  Buildup makes bathtubs and showers slippery. Use non-skid mats or decals on the floor of the bathtub or shower. Attach bath mats securely with double-sided, non-slip rug tape. If you need to sit down while you are in the shower, use a non-slip stool. Install grab bars by the toilet and in the bathtub and shower. Do not use towel bars as grab bars. What can I do in the bedroom? Make sure that you have a light by your bed that is easy to reach. Do not use any sheets or blankets on your bed  that hang to the floor. Have a firm bench or chair with side arms that you can use for support when you get dressed. What can I do in the kitchen? Clean up any spills right away. If you need to reach something above you, use a sturdy step stool that has a grab bar. Keep electrical cables out of the way. Do not use floor polish or wax that makes floors slippery. What can I do with my stairs? Do not leave anything on the stairs. Make sure that you have a light switch at the top and the bottom of the stairs. Have them installed if you do not have them. Make sure that there are handrails on both sides of the stairs. Fix handrails that are broken or loose. Make sure that handrails are as long as the staircases. Install non-slip stair treads on all stairs in your home if they do not have carpet. Avoid having throw rugs at the top or bottom of stairs, or secure the rugs with carpet tape to prevent them from moving. Choose a carpet design that does not hide the edge of steps on the stairs. Make sure that carpet is firmly attached to the stairs. Fix any carpet that is loose or worn. What can I do on the outside of my home? Use bright outdoor lighting. Repair the edges of walkways and driveways and fix any cracks. Clear paths of anything that can make you trip, such as tools or rocks. Add color or contrast paint or tape to clearly mark and help you see high doorway thresholds. Trim any bushes or trees on the main path into your home. Check that handrails are securely fastened and in good repair. Both sides of all steps should have handrails. Install guardrails along the edges of any raised decks or porches. Have leaves, snow, and ice cleared regularly. Use sand, salt, or ice melt on walkways during winter months if you live where there is ice and snow. In the garage, clean up any spills right away, including grease or oil spills. What other actions can I take? Review your medicines with your health  care provider. Some medicines can make you confused or feel dizzy. This can increase your chance of falling. Wear closed-toe shoes that fit well and support your feet. Wear shoes that have rubber soles and low heels. Use a cane, walker, scooter, or crutches that help you move around if needed. Talk with your provider about other ways that you can decrease your risk of falls. This may include seeing a physical therapist to learn to do exercises to improve movement and strength. Where to find more information Centers for Disease Control and Prevention, STEAD[employer]: tonerpromos.no [employer] on Aging: baseringtones.pl National Institute on Aging: baseringtones.pl Contact a health care provider if: You are afraid of falling at home. You feel weak, drowsy, or dizzy at home. You fall at home. Get help right away if you: Lose  consciousness or have trouble moving after a fall. Have a fall that causes a head injury. These symptoms may be an emergency. Get help right away. Call 911. Do not wait to see if the symptoms will go away. Do not drive yourself to the hospital. This information is not intended to replace advice given to you by your health care provider. Make sure you discuss any questions you have with your health care provider. Document Revised: 01/20/2022 Document Reviewed: 01/20/2022 Elsevier Patient Education  2024 [employer].

## 2024-04-11 ENCOUNTER — Ambulatory Visit
Admission: RE | Admit: 2024-04-11 | Discharge: 2024-04-11 | Disposition: A | Discharge status: Home / Self Care | Source: Ambulatory Visit | Attending: Family Medicine | Admitting: Family Medicine

## 2024-04-11 DIAGNOSIS — Z1231 Encounter for screening mammogram for malignant neoplasm of breast: Secondary | ICD-10-CM | POA: Diagnosis present

## 2024-04-14 ENCOUNTER — Ambulatory Visit: Payer: Self-pay | Admitting: Family Medicine

## 2024-04-29 ENCOUNTER — Telehealth: Payer: Self-pay

## 2024-10-03 ENCOUNTER — Ambulatory Visit: Admitting: Family Medicine

## 2025-04-12 ENCOUNTER — Ambulatory Visit
